# Patient Record
Sex: Male | Born: 1955 | Race: White | Hispanic: No | Marital: Married | Smoking: Former smoker
Health system: Southern US, Community
[De-identification: ages and names within clinical notes are randomized; demographics above are authoritative.]

## PROBLEM LIST (undated history)

## (undated) DIAGNOSIS — R972 Elevated prostate specific antigen [PSA]: Secondary | ICD-10-CM

## (undated) DIAGNOSIS — H269 Unspecified cataract: Secondary | ICD-10-CM

## (undated) DIAGNOSIS — N189 Chronic kidney disease, unspecified: Secondary | ICD-10-CM

## (undated) DIAGNOSIS — H409 Unspecified glaucoma: Secondary | ICD-10-CM

## (undated) DIAGNOSIS — T7840XA Allergy, unspecified, initial encounter: Secondary | ICD-10-CM

## (undated) HISTORY — PX: COLONOSCOPY: SHX174

## (undated) HISTORY — PX: OTHER SURGICAL HISTORY: SHX169

## (undated) HISTORY — PX: VASECTOMY: SHX75

## (undated) HISTORY — PX: PROSTATE BIOPSY: SHX241

## (undated) HISTORY — DX: Allergy, unspecified, initial encounter: T78.40XA

## (undated) HISTORY — DX: Unspecified cataract: H26.9

## (undated) HISTORY — DX: Elevated prostate specific antigen (PSA): R97.20

## (undated) HISTORY — DX: Chronic kidney disease, unspecified: N18.9

## (undated) HISTORY — PX: LITHOTRIPSY: SUR834

## (undated) HISTORY — DX: Unspecified glaucoma: H40.9

---

## 2019-05-18 ENCOUNTER — Encounter: Payer: Self-pay | Admitting: Gastroenterology

## 2019-06-15 ENCOUNTER — Other Ambulatory Visit: Payer: Self-pay

## 2019-06-15 ENCOUNTER — Ambulatory Visit: Payer: BC Managed Care – PPO | Admitting: *Deleted

## 2019-06-15 VITALS — Temp 97.3°F | Ht 71.0 in | Wt 153.0 lb

## 2019-06-15 DIAGNOSIS — Z1211 Encounter for screening for malignant neoplasm of colon: Secondary | ICD-10-CM

## 2019-06-15 MED ORDER — SUTAB 1479-225-188 MG PO TABS: 24.0000 | ORAL_TABLET | ORAL | 0 refills | Status: DC

## 2019-06-15 NOTE — Progress Notes (Signed)
05-12-2019- comp covid vaccines  No egg or soy allergy known to patient  No issues with past sedation with any surgeries  or procedures, no intubation problems  No diet pills per patient No home 02 use per patient  No blood thinners per patient  Pt denies issues with constipation  No A fib or A flutter  EMMI video sent to pt's e mail   Due to the COVID-19 pandemic we are asking patients to follow these guidelines. Please only bring one care partner. Please be aware that your care partner may wait in the car in the parking lot or if they feel like they will be too hot to wait in the car, they may wait in the lobby on the 4th floor. All care partners are required to wear a mask the entire time (we do not have any that we can provide them), they need to practice social distancing, and we will do a Covid check for all patient's and care partners when you arrive. Also we will check their temperature and your temperature. If the care partner waits in their car they need to stay in the parking lot the entire time and we will call them on their cell phone when the patient is ready for discharge so they can bring the car to the front of the building. Also all patient's will need to wear a mask into building.

## 2019-06-19 ENCOUNTER — Encounter: Payer: Self-pay | Admitting: Gastroenterology

## 2019-06-29 ENCOUNTER — Other Ambulatory Visit: Payer: Self-pay

## 2019-06-29 ENCOUNTER — Ambulatory Visit (AMBULATORY_SURGERY_CENTER): Payer: BC Managed Care – PPO | Admitting: Gastroenterology

## 2019-06-29 ENCOUNTER — Encounter: Payer: Self-pay | Admitting: Gastroenterology

## 2019-06-29 VITALS — BP 92/66 | HR 74 | Temp 97.5°F | Resp 16 | Ht 71.0 in | Wt 153.0 lb

## 2019-06-29 DIAGNOSIS — Z1211 Encounter for screening for malignant neoplasm of colon: Secondary | ICD-10-CM | POA: Diagnosis present

## 2019-06-29 MED ORDER — SODIUM CHLORIDE 0.9 % IV SOLN
500.0000 mL | INTRAVENOUS | Status: DC
Start: 2019-06-29 — End: 2023-01-12

## 2019-06-29 NOTE — Progress Notes (Signed)
pt tolerated well. VSS. awake and to recovery. Report given to RN.  

## 2019-06-29 NOTE — Patient Instructions (Signed)
Handouts on high fiber diet, hemorrhoids, and diverticulosis given to you today   YOU HAD AN ENDOSCOPIC PROCEDURE TODAY AT THE [facility]:   Refer to the procedure report that was given to you for any specific questions about what was found during the examination.  If the procedure report does not answer your questions, please call your gastroenterologist to clarify.  If you requested that your care partner not be given the details of your procedure findings, then the procedure report has been included in a sealed envelope for you to review at your convenience later.  YOU SHOULD EXPECT: Some feelings of bloating in the abdomen. Passage of more gas than usual.  Walking can help get rid of the air that was put into your GI tract during the procedure and reduce the bloating. If you had a lower endoscopy (such as a colonoscopy or flexible sigmoidoscopy) you may notice spotting of blood in your stool or on the toilet paper. If you underwent a bowel prep for your procedure, you may not have a normal bowel movement for a few days.  Please Note:  You might notice some irritation and congestion in your nose or some drainage.  This is from the oxygen used during your procedure.  There is no need for concern and it should clear up in a day or so.  SYMPTOMS TO REPORT IMMEDIATELY:   Following lower endoscopy (colonoscopy or flexible sigmoidoscopy):  Excessive amounts of blood in the stool  Significant tenderness or worsening of abdominal pains  Swelling of the abdomen that is new, acute  Fever of 100F or higher  For urgent or emergent issues, a gastroenterologist can be reached at any hour by calling [phone]. Do not use MyChart messaging for urgent concerns.    DIET:  We do recommend a small meal at first, but then you may proceed to your regular diet.  Drink plenty of fluids but you should avoid alcoholic beverages for 24 hours.  ACTIVITY:  You should plan to take it easy for the  rest of today and you should NOT DRIVE or use heavy machinery until tomorrow (because of the sedation medicines used during the test).    FOLLOW UP: Our staff will call the number listed on your records 48-72 hours following your procedure to check on you and address any questions or concerns that you may have regarding the information given to you following your procedure. If we do not reach you, we will leave a message.  We will attempt to reach you two times.  During this call, we will ask if you have developed any symptoms of COVID 19. If you develop any symptoms (ie: fever, flu-like symptoms, shortness of breath, cough etc.) before then, please call [phone].  If you test positive for Covid 19 in the 2 weeks post procedure, please call and report this information to [place].    SIGNATURES/CONFIDENTIALITY: You and/or your care partner have signed paperwork which will be entered into your electronic medical record.  These signatures attest to the fact that that the information above on your After Visit Summary has been reviewed and is understood.  Full responsibility of the confidentiality of this discharge information lies with you and/or your care-partner.

## 2019-06-29 NOTE — Op Note (Signed)
[place] Patient Name: Ricardo Orr Procedure Date: 06/29/2019 11:30 AM MRN: [id number] Endoscopist: Ladene Artist , MD Age: 64 Referring MD:  Date of Birth: 23-May-1955 Gender: Male Account #: [id number] Procedure:                Colonoscopy Indications:              Screening for colorectal malignant neoplasm Medicines:                Monitored Anesthesia Care Procedure:                Pre-Anesthesia Assessment:                           - Prior to the procedure, a History and Physical                            was performed, and patient medications and                            allergies were reviewed. The patient's tolerance of                            previous anesthesia was also reviewed. The risks                            and benefits of the procedure and the sedation                            options and risks were discussed with the patient.                            All questions were answered, and informed consent                            was obtained. Prior Anticoagulants: The patient has                            taken no previous anticoagulant or antiplatelet                            agents. ASA Grade Assessment: I - A normal, healthy                            patient. After reviewing the risks and benefits,                            the patient was deemed in satisfactory condition to                            undergo the procedure.                           After obtaining informed consent, the colonoscope  was passed under direct vision. Throughout the                            procedure, the patient's blood pressure, pulse, and                            oxygen saturations were monitored continuously. The                            Colonoscope was introduced through the anus and                            advanced to the the cecum, identified by                            appendiceal orifice and ileocecal  valve. The                            ileocecal valve, appendiceal orifice, and rectum                            were photographed. The quality of the bowel                            preparation was good. The colonoscopy was performed                            without difficulty. The patient tolerated the                            procedure well. Scope In: 11:38:06 AM Scope Out: 11:50:30 AM Scope Withdrawal Time: 0 hours 10 minutes 13 seconds  Total Procedure Duration: 0 hours 12 minutes 24 seconds  Findings:                 The perianal and digital rectal examinations were                            normal.                           Scattered small-mouthed diverticula were found in                            the entire colon. There was no evidence of                            diverticular bleeding.                           Internal hemorrhoids were found during                            retroflexion. The hemorrhoids were medium-sized and  Grade I (internal hemorrhoids that do not prolapse).                           The exam was otherwise without abnormality on                            direct and retroflexion views. Complications:            No immediate complications. Estimated blood loss:                            None. Estimated Blood Loss:     Estimated blood loss: none. Impression:               - Mild diverticulosis in the entire examined colon.                           - Internal hemorrhoids.                           - The examination was otherwise normal on direct                            and retroflexion views.                           - No specimens collected. Recommendation:           - Repeat colonoscopy in 10 years for screening                            purposes.                           - Patient has a contact number available for                            emergencies. The signs and symptoms of potential                             delayed complications were discussed with the                            patient. Return to normal activities tomorrow.                            Written discharge instructions were provided to the                            patient.                           - High fiber diet.                           - Continue present medications. Meryl Dare, MD 06/29/2019 11:53:08 AM This report has been signed electronically.

## 2019-06-29 NOTE — Progress Notes (Signed)
Vs CW I have reviewed the patient's medical history in detail and updated the computerized patient record.   

## 2019-07-03 ENCOUNTER — Telehealth: Payer: Self-pay

## 2019-07-03 NOTE — Telephone Encounter (Signed)
  Follow up Call-  Call back number 06/29/2019  Post procedure Call Back phone  # [phone]  Permission to leave phone message Yes     Patient questions:  Do you have a fever, pain , or abdominal swelling? No. Pain Score  0 *  Have you tolerated food without any problems? Yes.    Have you been able to return to your normal activities? Yes.    Do you have any questions about your discharge instructions: Diet   No. Medications  No. Follow up visit  No.  Do you have questions or concerns about your Care? No.  Actions: * If pain score is 4 or above: No action needed, pain <4.  1. Have you developed a fever since your procedure? no  2.   Have you had an respiratory symptoms (SOB or cough) since your procedure? no  3.   Have you tested positive for COVID 19 since your procedure no  4.   Have you had any family members/close contacts diagnosed with the COVID 19 since your procedure?  no   If yes to any of these questions please route to Laverna Peace, RN and Charlett Lango, RN

## 2019-07-12 ENCOUNTER — Other Ambulatory Visit: Payer: Self-pay

## 2019-07-13 ENCOUNTER — Ambulatory Visit (INDEPENDENT_AMBULATORY_CARE_PROVIDER_SITE_OTHER): Payer: BC Managed Care – PPO | Admitting: Family Medicine

## 2019-07-13 ENCOUNTER — Encounter: Payer: Self-pay | Admitting: Family Medicine

## 2019-07-13 VITALS — BP 116/64 | HR 75 | Temp 97.9°F | Wt 152.0 lb

## 2019-07-13 DIAGNOSIS — N200 Calculus of kidney: Secondary | ICD-10-CM

## 2019-07-13 DIAGNOSIS — M545 Low back pain: Secondary | ICD-10-CM

## 2019-07-13 DIAGNOSIS — Z0000 Encounter for general adult medical examination without abnormal findings: Secondary | ICD-10-CM | POA: Diagnosis not present

## 2019-07-13 DIAGNOSIS — H409 Unspecified glaucoma: Secondary | ICD-10-CM | POA: Diagnosis not present

## 2019-07-13 DIAGNOSIS — G8929 Other chronic pain: Secondary | ICD-10-CM

## 2019-07-13 DIAGNOSIS — N400 Enlarged prostate without lower urinary tract symptoms: Secondary | ICD-10-CM

## 2019-07-13 DIAGNOSIS — R972 Elevated prostate specific antigen [PSA]: Secondary | ICD-10-CM

## 2019-07-13 NOTE — Patient Instructions (Signed)
Preventive Care 41-62 Years Old, Male Preventive care refers to lifestyle choices and visits with your health care provider that can promote health and wellness. This includes:  A yearly physical exam. This is also called an annual well check.  Regular dental and eye exams.  Immunizations.  Screening for certain conditions.  Healthy lifestyle choices, such as eating a healthy diet, getting regular exercise, not using drugs or products that contain nicotine and tobacco, and limiting alcohol use. What can I expect for my preventive care visit? Physical exam Your health care provider will check:  Height and weight. These may be used to calculate body mass index (BMI), which is a measurement that tells if you are at a healthy weight.  Heart rate and blood pressure.  Your skin for abnormal spots. Counseling Your health care provider may ask you questions about:  Alcohol, tobacco, and drug use.  Emotional well-being.  Home and relationship well-being.  Sexual activity.  Eating habits.  Work and work Statistician. What immunizations do I need?  Influenza (flu) vaccine  This is recommended every year. Tetanus, diphtheria, and pertussis (Tdap) vaccine  You may need a Td booster every 10 years. Varicella (chickenpox) vaccine  You may need this vaccine if you have not already been vaccinated. Zoster (shingles) vaccine  You may need this after age 64. Measles, mumps, and rubella (MMR) vaccine  You may need at least one dose of MMR if you were born in 1957 or later. You may also need a second dose. Pneumococcal conjugate (PCV13) vaccine  You may need this if you have certain conditions and were not previously vaccinated. Pneumococcal polysaccharide (PPSV23) vaccine  You may need one or two doses if you smoke cigarettes or if you have certain conditions. Meningococcal conjugate (MenACWY) vaccine  You may need this if you have certain conditions. Hepatitis A  vaccine  You may need this if you have certain conditions or if you travel or work in places where you may be exposed to hepatitis A. Hepatitis B vaccine  You may need this if you have certain conditions or if you travel or work in places where you may be exposed to hepatitis B. Haemophilus influenzae type b (Hib) vaccine  You may need this if you have certain risk factors. Human papillomavirus (HPV) vaccine  If recommended by your health care provider, you may need three doses over 6 months. You may receive vaccines as individual doses or as more than one vaccine together in one shot (combination vaccines). Talk with your health care provider about the risks and benefits of combination vaccines. What tests do I need? Blood tests  Lipid and cholesterol levels. These may be checked every 5 years, or more frequently if you are over 60 years old.  Hepatitis C test.  Hepatitis B test. Screening  Lung cancer screening. You may have this screening every year starting at age 43 if you have a 30-pack-year history of smoking and currently smoke or have quit within the past 15 years.  Prostate cancer screening. Recommendations will vary depending on your family history and other risks.  Colorectal cancer screening. All adults should have this screening starting at age 72 and continuing until age 2. Your health care provider may recommend screening at age 14 if you are at increased risk. You will have tests every 1-10 years, depending on your results and the type of screening test.  Diabetes screening. This is done by checking your blood sugar (glucose) after you have not eaten  for a while (fasting). You may have this done every 1-3 years.  Sexually transmitted disease (STD) testing. Follow these instructions at home: Eating and drinking  Eat a diet that includes fresh fruits and vegetables, whole grains, lean protein, and low-fat dairy products.  Take vitamin and mineral supplements as  recommended by your health care provider.  Do not drink alcohol if your health care provider tells you not to drink.  If you drink alcohol: ? Limit how much you have to 0-2 drinks a day. ? Be aware of how much alcohol is in your drink. In the [place] one drink equals one 12 oz bottle of beer (355 mL), one 5 oz glass of wine (148 mL), or one 1 oz glass of hard liquor (44 mL). Lifestyle  Take daily care of your teeth and gums.  Stay active. Exercise for at least 30 minutes on 5 or more days each week.  Do not use any products that contain nicotine or tobacco, such as cigarettes, e-cigarettes, and chewing tobacco. If you need help quitting, ask your health care provider.  If you are sexually active, practice safe sex. Use a condom or other form of protection to prevent STIs (sexually transmitted infections).  Talk with your health care provider about taking a low-dose aspirin every day starting at age 53. What's next?  Go to your health care provider once a year for a well check visit.  Ask your health care provider how often you should have your eyes and teeth checked.  Stay up to date on all vaccines. This information is not intended to replace advice given to you by your health care provider. Make sure you discuss any questions you have with your health care provider. Document Revised: 01/12/2018 Document Reviewed: 01/12/2018 Elsevier Patient Education  2020 [employer].

## 2019-07-13 NOTE — Progress Notes (Signed)
New Patient Office Visit  Subjective:  Patient ID: Ricardo Orr, male    DOB: 1955-03-01  Age: 64 y.o. MRN: [id number]  CC:  Chief Complaint  Patient presents with   New Patient (Initial Visit)    pt is here to establish care with no other concerns     HPI Ricardo Orr presents for establishing care and for well visit.  He and his wife moved here last October from [place].  Past medical history reviewed.  He has glaucoma and is on drops for that.  He has had history of recurrent calcium kidney stones and has had a couple surgeries previously for that.  Has had prior vasectomy.  He reports history of elevated PSA.  He has some BPH and is on Avodart for that.  He states he has had 3 prostate biopsies previously all negative for cancer.  He also recalls having MRI of the prostate previously.  He is requesting referral to establish with a local urologist.  He also relates some intermittent chronic right low back pain for the past 2 years.  Usually worse after activities.  Currently very sedentary.  Prior to moving here he was fairly active with running several days per week and some cycling.  He has been working from home remotely 6 AM to sometimes as late as 10 PM.  Getting very little exercise.  Sitting most of the day  Denies any radiculitis symptoms.  No weakness or numbness.  No appetite or weight changes.  Social history-married.  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  He smoked briefly but quit 30 years ago.  Approximately 10-year history.  Only occasional alcohol use.  He has 3 children a daughter and 2 sons.  They are all grown.  Family history-both parents died the past year both at age 67.  His mother had some dementia late in life.  He has a sister and a brother who are alive and well.  Children have no medical problems.  Past Medical History:  Diagnosis Date   Allergy    Cataract    forming    Chronic kidney disease    chronic  kidney stones-    Elevated PSA    Glaucoma     Past Surgical History:  Procedure Laterality Date   COLONOSCOPY     11 yrs ago- normal per pt    kidney stone extraction     x2   LITHOTRIPSY     PROSTATE BIOPSY     x3   VASECTOMY      Family History  Problem Relation Age of Onset   Hearing loss Father    Colon cancer Neg Hx    Colon polyps Neg Hx    Esophageal cancer Neg Hx    Rectal cancer Neg Hx    Stomach cancer Neg Hx     Social History   Socioeconomic History   Marital status: Married    Spouse name: Not on file   Number of children: Not on file   Years of education: Not on file   Highest education level: Not on file  Occupational History   Not on file  Tobacco Use   Smoking status: Former Smoker   Smokeless tobacco: Never Used  Substance and Sexual Activity   Alcohol use: Yes    Comment: occ    Drug use: Never   Sexual activity: Yes    Partners: Female  Other Topics Concern   Not  on file  Social History Narrative   Not on file   Social Determinants of Health   Financial Resource Strain:    Difficulty of Paying Living Expenses:   Food Insecurity:    Worried About Charity fundraiser in the Last Year:    Arboriculturist in the Last Year:   Transportation Needs:    Film/video editor (Medical):    Lack of Transportation (Non-Medical):   Physical Activity:    Days of Exercise per Week:    Minutes of Exercise per Session:   Stress:    Feeling of Stress :   Social Connections:    Frequency of Communication with Friends and Family:    Frequency of Social Gatherings with Friends and Family:    Attends Religious Services:    Active Member of Clubs or Organizations:    Attends Music therapist:    Marital Status:   Intimate Partner Violence:    Fear of Current or Ex-Partner:    Emotionally Abused:    Physically Abused:    Sexually Abused:     ROS Review of Systems  Constitutional:  Negative for activity change, appetite change, fatigue, fever and unexpected weight change.  HENT: Negative for congestion, ear pain and trouble swallowing.   Eyes: Negative for pain and visual disturbance.  Respiratory: Negative for cough, shortness of breath and wheezing.   Cardiovascular: Negative for chest pain and palpitations.  Gastrointestinal: Negative for abdominal distention, abdominal pain, blood in stool, constipation, diarrhea, nausea, rectal pain and vomiting.  Genitourinary: Negative for dysuria, hematuria and testicular pain.  Musculoskeletal: Positive for back pain. Negative for arthralgias and joint swelling.  Skin: Negative for rash.  Neurological: Negative for dizziness, syncope and headaches.  Hematological: Negative for adenopathy.  Psychiatric/Behavioral: Negative for confusion and dysphoric mood.    Objective:   Today's Vitals: BP 116/64 (BP Location: Left Arm, Patient Position: Sitting, Cuff Size: Normal)    Pulse 75    Temp 97.9 F (36.6 C) (Temporal)    Wt 152 lb (68.9 kg)    SpO2 94%    BMI 21.20 kg/m   Physical Exam Vitals reviewed.  Constitutional:      Appearance: Normal appearance.  HENT:     Right Ear: Tympanic membrane normal.     Left Ear: Tympanic membrane normal.  Cardiovascular:     Rate and Rhythm: Normal rate and regular rhythm.     Heart sounds: No murmur heard.   Pulmonary:     Effort: Pulmonary effort is normal.     Breath sounds: Normal breath sounds.  Abdominal:     Palpations: Abdomen is soft. There is no mass.     Tenderness: There is no abdominal tenderness.  Musculoskeletal:     Cervical back: Neck supple.     Right lower leg: No edema.     Left lower leg: No edema.  Lymphadenopathy:     Cervical: No cervical adenopathy.  Skin:    Findings: No rash.  Neurological:     General: No focal deficit present.     Mental Status: He is alert.     Cranial Nerves: No cranial nerve deficit.  Psychiatric:        Mood and Affect:  Mood normal.        Thought Content: Thought content normal.     Assessment & Plan:   #1 physical exam.  The following issues were addressed from health maintenance standpoint -Covid vaccines already given -Colonoscopy  was just completed -No records of known hepatitis C screening and this will be done -Confirm date of last tetanus but he thinks this was in the past couple years -Has already received previous shingles vaccine  #2 history of elevated PSA with negative previous prostate biopsies.  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  This sounds more musculoskeletal from history.  Outpatient Encounter Medications as of 07/13/2019  Medication Sig   cetirizine (ZYRTEC) 10 MG tablet Take 10 mg by mouth daily.   dutasteride (AVODART) 0.5 MG capsule    fluticasone (FLONASE) 50 MCG/ACT nasal spray Place into both nostrils daily as needed for allergies or rhinitis.   latanoprost (XALATAN) 0.005 % ophthalmic solution    sildenafil (REVATIO) 20 MG tablet TAKE 1 TO 5 TABLETS BY MOUTH 30 MINUTES PRIOR TO ACTIVITY   Sodium Sulfate-Mag Sulfate-KCl (SUTAB) [phone] MG TABS Take 24 tablets by mouth as directed.   Facility-Administered Encounter Medications as of 07/13/2019  Medication   0.9 %  sodium chloride infusion    Follow-up: No follow-ups on file.   Evelena Peat, MD

## 2019-07-14 DIAGNOSIS — R972 Elevated prostate specific antigen [PSA]: Secondary | ICD-10-CM | POA: Insufficient documentation

## 2019-07-14 DIAGNOSIS — N400 Enlarged prostate without lower urinary tract symptoms: Secondary | ICD-10-CM | POA: Insufficient documentation

## 2019-07-16 LAB — HEPATITIS C ANTIBODY
Hepatitis C Ab: NONREACTIVE
SIGNAL TO CUT-OFF: 0.01 (ref <1.00)

## 2019-07-16 LAB — HEPATIC FUNCTION PANEL
AG Ratio: 2.4 (calc) (ref 1.0–2.5)
ALT: 11 U/L (ref 9–46)
AST: 15 U/L (ref 10–35)
Albumin: 4.7 g/dL (ref 3.6–5.1)
Alkaline phosphatase (APISO): 63 U/L (ref 35–144)
Bilirubin, Direct: 0.1 mg/dL (ref 0.0–0.2)
Globulin: 2 g/dL (calc) (ref 1.9–3.7)
Indirect Bilirubin: 0.7 mg/dL (calc) (ref 0.2–1.2)
Total Bilirubin: 0.8 mg/dL (ref 0.2–1.2)
Total Protein: 6.7 g/dL (ref 6.1–8.1)

## 2019-07-16 LAB — LIPID PANEL
Cholesterol: 215 mg/dL — ABNORMAL HIGH (ref <200)
HDL: 63 mg/dL (ref >=40)
LDL Cholesterol (Calc): 135 mg/dL (calc) — ABNORMAL HIGH
Non-HDL Cholesterol (Calc): 152 mg/dL (calc) — ABNORMAL HIGH (ref <130)
Total CHOL/HDL Ratio: 3.4 (calc) (ref <5.0)
Triglycerides: 80 mg/dL (ref <150)

## 2019-07-16 LAB — CBC WITH DIFFERENTIAL/PLATELET
Absolute Monocytes: 462 cells/uL (ref 200–950)
Basophils Absolute: 78 cells/uL (ref 0–200)
Basophils Relative: 1.3 %
Eosinophils Absolute: 48 cells/uL (ref 15–500)
Eosinophils Relative: 0.8 %
HCT: 44.2 % (ref 38.5–50.0)
Hemoglobin: 15.1 g/dL (ref 13.2–17.1)
Lymphs Abs: 1200 cells/uL (ref 850–3900)
MCH: 30.8 pg (ref 27.0–33.0)
MCHC: 34.2 g/dL (ref 32.0–36.0)
MCV: 90 fL (ref 80.0–100.0)
MPV: 10.8 fL (ref 7.5–12.5)
Monocytes Relative: 7.7 %
Neutro Abs: 4212 cells/uL (ref 1500–7800)
Neutrophils Relative %: 70.2 %
Platelets: 219 10*3/uL (ref 140–400)
RBC: 4.91 10*6/uL (ref 4.20–5.80)
RDW: 13 % (ref 11.0–15.0)
Total Lymphocyte: 20 %
WBC: 6 10*3/uL (ref 3.8–10.8)

## 2019-07-16 LAB — SEDIMENTATION RATE: Sed Rate: 6 mm/h (ref 0–20)

## 2019-07-16 LAB — TSH: TSH: 1.59 mIU/L (ref 0.40–4.50)

## 2019-07-16 LAB — BASIC METABOLIC PANEL
BUN: 22 mg/dL (ref 7–25)
CO2: 27 mmol/L (ref 20–32)
Calcium: 9.8 mg/dL (ref 8.6–10.3)
Chloride: 102 mmol/L (ref 98–110)
Creat: 0.99 mg/dL (ref 0.70–1.25)
Glucose, Bld: 75 mg/dL (ref 65–99)
Potassium: 4.3 mmol/L (ref 3.5–5.3)
Sodium: 141 mmol/L (ref 135–146)

## 2019-07-23 ENCOUNTER — Encounter: Payer: Self-pay | Admitting: Family Medicine
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
## 2020-11-07 ENCOUNTER — Ambulatory Visit: Payer: Self-pay

## 2020-11-07 ENCOUNTER — Ambulatory Visit (INDEPENDENT_AMBULATORY_CARE_PROVIDER_SITE_OTHER): Payer: BC Managed Care – PPO | Admitting: Family Medicine

## 2020-11-07 ENCOUNTER — Other Ambulatory Visit: Payer: Self-pay

## 2020-11-07 VITALS — BP 122/84 | HR 72 | Ht 71.0 in | Wt 152.0 lb

## 2020-11-07 DIAGNOSIS — M19049 Primary osteoarthritis, unspecified hand: Secondary | ICD-10-CM | POA: Diagnosis not present

## 2020-11-07 DIAGNOSIS — M25539 Pain in unspecified wrist: Secondary | ICD-10-CM | POA: Diagnosis not present

## 2020-11-07 DIAGNOSIS — M25521 Pain in right elbow: Secondary | ICD-10-CM | POA: Diagnosis not present

## 2020-11-07 MED ORDER — DOXYCYCLINE HYCLATE 100 MG PO TABS
100.0000 mg | ORAL_TABLET | Freq: Two times a day (BID) | ORAL | 0 refills | Status: DC
Start: 2020-11-07 — End: 2020-12-30

## 2020-11-07 NOTE — Assessment & Plan Note (Signed)
Patient had a fall many months ago.  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  Patient is tender in this area.  We will put patient on doxycycline as needed discussed that there should be absorbed over the course of time.  Discussed which activities to do and which ones to avoid.  Increase activity slowly.  Follow-up with me again in 6 to 8 weeks.

## 2020-11-07 NOTE — Patient Instructions (Addendum)
Thumb spica wear at night Voltaren gel and heat for finger See you again in 4-6 weeks

## 2020-11-07 NOTE — Assessment & Plan Note (Signed)
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  We discussed potential x-rays which patient declined.  Patient does have some hypoechoic changes noted discussed which activities to do which wants to avoid.  Patient decreased any type of repetitive motion.  Discussed home exercises.  Discussed placing overnight given a thumb spica splint.  Patient will increase activity slowly.  Follow-up again in 6 to 8 weeks.

## 2020-12-02 DIAGNOSIS — H401131 Primary open-angle glaucoma, bilateral, mild stage: Secondary | ICD-10-CM | POA: Diagnosis not present

## 2020-12-02 DIAGNOSIS — H25812 Combined forms of age-related cataract, left eye: Secondary | ICD-10-CM | POA: Diagnosis not present

## 2020-12-02 DIAGNOSIS — H43811 Vitreous degeneration, right eye: Secondary | ICD-10-CM | POA: Diagnosis not present

## 2020-12-02 DIAGNOSIS — H2511 Age-related nuclear cataract, right eye: Secondary | ICD-10-CM | POA: Diagnosis not present

## 2020-12-22 DIAGNOSIS — H401131 Primary open-angle glaucoma, bilateral, mild stage: Secondary | ICD-10-CM | POA: Diagnosis not present

## 2020-12-22 NOTE — Progress Notes (Signed)
[facility] [address] [place] [address] Phone: [phone] Subjective:   Ricardo Orr, am serving as a scribe for Dr. Antoine Orr. This visit occurred during the SARS-CoV-2 public health emergency.  Safety protocols were in place, including screening questions prior to the visit, additional usage of staff PPE, and extensive cleaning of exam room while observing appropriate contact time as indicated for disinfecting solutions.   I'm seeing this patient by the request  of:  Ricardo Covey, MD  CC: Hand and elbow pain  [id number]  11/07/2020 Patient had a fall many months ago.  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  Patient is tender in this area.  We will put patient on doxycycline as needed discussed that there should be absorbed over the course of time.  Discussed which activities to do and which ones to avoid.  Increase activity slowly.  Follow-up with me again in 6 to 8 weeks. 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  We discussed potential x-rays which patient declined.  Patient does have some hypoechoic changes noted discussed which activities to do which wants to avoid.  Patient decreased any type of repetitive motion.  Discussed home exercises.  Discussed placing overnight given a thumb spica splint.  Patient will increase activity slowly.  Follow-up again in 6 to 8 weeks.  Updated 12/23/2020 Ricardo Orr is a 65 y.o. male coming in with complaint of wrist pain and seem to be more of the Ricardo Orr arthritis.  And patient did have what appeared to be calcific changes of the elbow with potential loose bodies.  Due to the potential for infection secondary to the changes or the calcium oxalate was given doxycycline and discussed different dietary issues.  Patient given a brace for Continue topical  anti-inflammatories.  Patient states wrist doing well. Voltaren works well on it. No new complaints. Check infection in elbow..  Patient states that he would feel 100% better at this time.       Past Medical History:  Diagnosis Date   Allergy    Cataract    forming    Chronic kidney disease    chronic kidney stones-    Elevated PSA    Glaucoma    Past Surgical History:  Procedure Laterality Date   COLONOSCOPY     11 yrs ago- normal per pt    kidney stone extraction     x2   LITHOTRIPSY     PROSTATE BIOPSY     x3   VASECTOMY     Social History   Socioeconomic History   Marital status: Married    Spouse name: Not on file   Number of children: Not on file   Years of education: Not on file   Highest education level: Not on file  Occupational History   Not on file  Tobacco Use   Smoking status: Former   Smokeless tobacco: Never  Substance and Sexual Activity   Alcohol use: Yes    Comment: occ    Drug use: Never   Sexual activity: Yes    Partners: Female  Other Topics Concern   Not on file  Social History Narrative   Not on file   Social Determinants of Health   Financial Resource Strain: Not on file  Food Insecurity: Not on file  Transportation Needs: Not on file  Physical Activity: Not on file  Stress: Not on file  Social Connections: Not on file   No Known Allergies Family History  Problem Relation Age of Onset   Dementia Mother    Hearing loss Father    Colon cancer Neg Hx    Colon polyps Neg Hx    Esophageal cancer Neg Hx    Rectal cancer Neg Hx    Stomach cancer Neg Hx       Current Outpatient Medications (Cardiovascular):    sildenafil (REVATIO) 20 MG tablet, TAKE 1 TO 5 TABLETS BY MOUTH 30 MINUTES PRIOR TO ACTIVITY   Current Outpatient Medications (Respiratory):    cetirizine (ZYRTEC) 10 MG tablet, Take 10 mg by mouth daily.   fluticasone (FLONASE) 50 MCG/ACT nasal spray, Place into both nostrils daily as needed for allergies or  rhinitis.       Current Outpatient Medications (Other):    doxycycline (VIBRA-TABS) 100 MG tablet, Take 1 tablet (100 mg total) by mouth 2 (two) times daily.   dutasteride (AVODART) 0.5 MG capsule,    latanoprost (XALATAN) 0.005 % ophthalmic solution,    Sodium Sulfate-Mag Sulfate-KCl (SUTAB) [phone] MG TABS, Take 24 tablets by mouth as directed.  Current Facility-Administered Medications (Other):    0.9 %  sodium chloride infusion    Objective  Blood pressure 122/82, pulse 84, height 5\' 11"  (1.803 m), weight 155 lb (70.3 kg), SpO2 98 %.   General: No apparent distress alert and oriented x3 mood and affect normal, dressed appropriately.  HEENT: Pupils equal, extraocular movements intact  Respiratory: Patient's speak in full sentences and does not appear short of breath  Cardiovascular: No lower extremity edema, non tender, no erythema  Gait normal with good balance and coordination.  MSK: Hand exam shows no significant difficulty.  Negative Tinel's Right elbow exam does have good range of motion.  Nontender on exam today.  No significant swelling.   Impression and Recommendations:     The above documentation has been reviewed and is accurate and complete , DO

## 2020-12-23 ENCOUNTER — Ambulatory Visit (INDEPENDENT_AMBULATORY_CARE_PROVIDER_SITE_OTHER): Payer: BC Managed Care – PPO | Admitting: Family Medicine

## 2020-12-23 ENCOUNTER — Other Ambulatory Visit: Payer: Self-pay

## 2020-12-23 ENCOUNTER — Ambulatory Visit: Payer: Self-pay

## 2020-12-23 VITALS — BP 122/82 | HR 84 | Ht 71.0 in | Wt 155.0 lb

## 2020-12-23 DIAGNOSIS — M25539 Pain in unspecified wrist: Secondary | ICD-10-CM | POA: Diagnosis not present

## 2020-12-23 DIAGNOSIS — M19049 Primary osteoarthritis, unspecified hand: Secondary | ICD-10-CM | POA: Diagnosis not present

## 2020-12-23 DIAGNOSIS — M25521 Pain in right elbow: Secondary | ICD-10-CM

## 2020-12-23 NOTE — Patient Instructions (Signed)
See me when you need me 

## 2020-12-23 NOTE — Assessment & Plan Note (Signed)
Improvement noted at this time.  Discussed worsening pain can consider the possibility of other injections.  Discussed with patient about potential bracing at night still.  Follow-up with me as needed

## 2020-12-23 NOTE — Assessment & Plan Note (Signed)
Has some loose bodies noted.  Patient knows not having any difficulty.  No signs of any inflammation at the moment either.  Full range of motion.  Patient can follow-up as needed.

## 2020-12-30 ENCOUNTER — Ambulatory Visit (INDEPENDENT_AMBULATORY_CARE_PROVIDER_SITE_OTHER): Payer: BC Managed Care – PPO | Admitting: Family Medicine

## 2020-12-30 VITALS — BP 120/80 | HR 75 | Temp 98.1°F | Ht 71.0 in | Wt 155.0 lb

## 2020-12-30 DIAGNOSIS — Z0000 Encounter for general adult medical examination without abnormal findings: Secondary | ICD-10-CM

## 2020-12-30 LAB — CBC WITH DIFFERENTIAL/PLATELET
Basophils Absolute: 0.1 10*3/uL (ref 0.0–0.1)
Basophils Relative: 1.1 % (ref 0.0–3.0)
Eosinophils Absolute: 0.1 10*3/uL (ref 0.0–0.7)
Eosinophils Relative: 2 % (ref 0.0–5.0)
HCT: 43.6 % (ref 39.0–52.0)
Hemoglobin: 14.9 g/dL (ref 13.0–17.0)
Lymphocytes Relative: 24.5 % (ref 12.0–46.0)
Lymphs Abs: 1.3 10*3/uL (ref 0.7–4.0)
MCHC: 34.3 g/dL (ref 30.0–36.0)
MCV: 91 fl (ref 78.0–100.0)
Monocytes Absolute: 0.6 10*3/uL (ref 0.1–1.0)
Monocytes Relative: 11.5 % (ref 3.0–12.0)
Neutro Abs: 3.1 10*3/uL (ref 1.4–7.7)
Neutrophils Relative %: 60.9 % (ref 43.0–77.0)
Platelets: 197 10*3/uL (ref 150.0–400.0)
RBC: 4.79 Mil/uL (ref 4.22–5.81)
RDW: 13 % (ref 11.5–15.5)
WBC: 5.2 10*3/uL (ref 4.0–10.5)

## 2020-12-30 LAB — HEPATIC FUNCTION PANEL
ALT: 13 U/L (ref 0–53)
AST: 17 U/L (ref 0–37)
Albumin: 4.5 g/dL (ref 3.5–5.2)
Alkaline Phosphatase: 65 U/L (ref 39–117)
Bilirubin, Direct: 0.1 mg/dL (ref 0.0–0.3)
Total Bilirubin: 0.8 mg/dL (ref 0.2–1.2)
Total Protein: 7.2 g/dL (ref 6.0–8.3)

## 2020-12-30 LAB — TSH: TSH: 1.72 u[IU]/mL (ref 0.35–5.50)

## 2020-12-30 LAB — BASIC METABOLIC PANEL
BUN: 22 mg/dL (ref 6–23)
CO2: 30 mEq/L (ref 19–32)
Calcium: 9.9 mg/dL (ref 8.4–10.5)
Chloride: 102 mEq/L (ref 96–112)
Creatinine, Ser: 1.17 mg/dL (ref 0.40–1.50)
GFR: 66.16 mL/min (ref >60.00)
Glucose, Bld: 95 mg/dL (ref 70–99)
Potassium: 4.2 mEq/L (ref 3.5–5.1)
Sodium: 138 mEq/L (ref 135–145)

## 2020-12-30 LAB — LIPID PANEL
Cholesterol: 205 mg/dL — ABNORMAL HIGH (ref 0–200)
HDL: 64.8 mg/dL (ref >39.00)
LDL Cholesterol: 125 mg/dL — ABNORMAL HIGH (ref 0–99)
NonHDL: 140.59
Total CHOL/HDL Ratio: 3
Triglycerides: 77 mg/dL (ref 0.0–149.0)
VLDL: 15.4 mg/dL (ref 0.0–40.0)

## 2020-12-30 NOTE — Progress Notes (Signed)
Established Patient Office Visit  Subjective:  Patient ID: Ricardo Orr, male    DOB: 05/04/1955  Age: 65 y.o. MRN: [id number]  CC:  Chief Complaint  Patient presents with   Annual Exam    HPI Ricardo Orr presents for physical exam.   He just established care here a year ago.  Family moved here from [place].  He continues to work with [employer].  Has history of kidney stones and history of BPH with elevated PSAs.  Has had multiple previous biopsies negative for cancer.  He has established with local urologist.  Generally doing well.  Flu vaccine already given.  COVID vaccines up-to-date.  Tetanus up-to-date.  He thinks he had tetanus booster with Tdap 2020.  Shingles vaccine already given.  Colonoscopy up-to-date.  He is getting PSA screening through urology  Social history-married and has 3 children including 1 daughter and 2 sons.  His daughter lives here and she has 2 children ages 23 and 83.  His sons live in [place] and [place].  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  Smoked only briefly and quit over 30 years ago.  Only occasional alcohol use.  Family history-reviewed.  Parents both had dementia mid 90s.  He has a brother who has history of what sounds like cervical spondylosis with cord compression.  Sister alive and well.  No family history of premature CAD or diabetes.  Past Medical History:  Diagnosis Date   Allergy    Cataract    forming    Chronic kidney disease    chronic kidney stones-    Elevated PSA    Glaucoma     Past Surgical History:  Procedure Laterality Date   COLONOSCOPY     11 yrs ago- normal per pt    kidney stone extraction     x2   LITHOTRIPSY     PROSTATE BIOPSY     x3   VASECTOMY      Family History  Problem Relation Age of Onset   Dementia Mother    Hearing loss Father    Colon cancer Neg Hx    Colon polyps Neg Hx    Esophageal cancer Neg Hx    Rectal cancer Neg Hx    Stomach cancer Neg Hx     Social  History   Socioeconomic History   Marital status: Married    Spouse name: Not on file   Number of children: Not on file   Years of education: Not on file   Highest education level: Not on file  Occupational History   Not on file  Tobacco Use   Smoking status: Former   Smokeless tobacco: Never  Substance and Sexual Activity   Alcohol use: Yes    Comment: occ    Drug use: Never   Sexual activity: Yes    Partners: Female  Other Topics Concern   Not on file  Social History Narrative   Not on file   Social Determinants of Health   Financial Resource Strain: Not on file  Food Insecurity: Not on file  Transportation Needs: Not on file  Physical Activity: Not on file  Stress: Not on file  Social Connections: Not on file  Intimate Partner Violence: Not on file    Outpatient Medications Prior to Visit  Medication Sig Dispense Refill   dutasteride (AVODART) 0.5 MG capsule      latanoprost (XALATAN) 0.005 % ophthalmic solution      sildenafil (REVATIO) 20 MG tablet TAKE  1 TO 5 TABLETS BY MOUTH 30 MINUTES PRIOR TO ACTIVITY     cetirizine (ZYRTEC) 10 MG tablet Take 10 mg by mouth daily.     doxycycline (VIBRA-TABS) 100 MG tablet Take 1 tablet (100 mg total) by mouth 2 (two) times daily. 14 tablet 0   fluticasone (FLONASE) 50 MCG/ACT nasal spray Place into both nostrils daily as needed for allergies or rhinitis.     Sodium Sulfate-Mag Sulfate-KCl (SUTAB) [phone] MG TABS Take 24 tablets by mouth as directed. 24 tablet 0   Facility-Administered Medications Prior to Visit  Medication Dose Route Frequency Provider Last Rate Last Admin   0.9 %  sodium chloride infusion  500 mL Intravenous Continuous Ladene Artist, MD        No Known Allergies  ROS Review of Systems  Constitutional:  Negative for activity change, appetite change, fatigue and fever.  HENT:  Negative for congestion, ear pain and trouble swallowing.   Eyes:  Negative for pain and visual disturbance.   Respiratory:  Negative for cough, shortness of breath and wheezing.   Cardiovascular:  Negative for chest pain and palpitations.  Gastrointestinal:  Negative for abdominal distention, abdominal pain, blood in stool, constipation, diarrhea, nausea, rectal pain and vomiting.  Genitourinary:  Negative for dysuria, hematuria and testicular pain.  Musculoskeletal:  Negative for arthralgias and joint swelling.  Skin:  Negative for rash.  Neurological:  Negative for dizziness, syncope and headaches.  Hematological:  Negative for adenopathy.  Psychiatric/Behavioral:  Negative for confusion and dysphoric mood.      Objective:    Physical Exam Constitutional:      General: He is not in acute distress.    Appearance: He is well-developed.  HENT:     Head: Normocephalic and atraumatic.     Right Ear: External ear normal.     Left Ear: External ear normal.  Eyes:     Conjunctiva/sclera: Conjunctivae normal.     Pupils: Pupils are equal, round, and reactive to light.  Neck:     Thyroid: No thyromegaly.  Cardiovascular:     Rate and Rhythm: Normal rate and regular rhythm.     Heart sounds: Normal heart sounds. No murmur heard. Pulmonary:     Effort: No respiratory distress.     Breath sounds: No wheezing or rales.  Abdominal:     General: Bowel sounds are normal. There is no distension.     Palpations: Abdomen is soft. There is no mass.     Tenderness: There is no abdominal tenderness. There is no guarding or rebound.  Musculoskeletal:     Cervical back: Normal range of motion and neck supple.     Right lower leg: No edema.     Left lower leg: No edema.  Lymphadenopathy:     Cervical: No cervical adenopathy.  Skin:    Findings: No rash.  Neurological:     Mental Status: He is alert and oriented to person, place, and time.     Cranial Nerves: No cranial nerve deficit.     Deep Tendon Reflexes: Reflexes normal.    BP 120/80 (BP Location: Left Arm, Patient Position: Sitting, Cuff  Size: Normal)   Pulse 75   Temp 98.1 F (36.7 C) (Oral)   Ht 5\' 11"  (1.803 m)   Wt 155 lb (70.3 kg)   SpO2 99%   BMI 21.62 kg/m  Wt Readings from Last 3 Encounters:  12/30/20 155 lb (70.3 kg)  12/23/20 155 lb (70.3 kg)  11/07/20  152 lb (68.9 kg)     Health Maintenance Due  Topic Date Due   HIV Screening  Never done   TETANUS/TDAP  Never done   COVID-19 Vaccine (3 - Booster for Pfizer series) 06/23/2019    There are no preventive care reminders to display for this patient.  Lab Results  Component Value Date   TSH 1.59 07/13/2019   Lab Results  Component Value Date   WBC 6.0 07/13/2019   HGB 15.1 07/13/2019   HCT 44.2 07/13/2019   MCV 90.0 07/13/2019   PLT 219 07/13/2019   Lab Results  Component Value Date   NA 141 07/13/2019   K 4.3 07/13/2019   CO2 27 07/13/2019   GLUCOSE 75 07/13/2019   BUN 22 07/13/2019   CREATININE 0.99 07/13/2019   BILITOT 0.8 07/13/2019   AST 15 07/13/2019   ALT 11 07/13/2019   PROT 6.7 07/13/2019   CALCIUM 9.8 07/13/2019   Lab Results  Component Value Date   CHOL 215 (H) 07/13/2019   Lab Results  Component Value Date   HDL 63 07/13/2019   Lab Results  Component Value Date   LDLCALC 135 (H) 07/13/2019   Lab Results  Component Value Date   TRIG 80 07/13/2019   Lab Results  Component Value Date   CHOLHDL 3.4 07/13/2019   No results found for: HGBA1C    Assessment & Plan:   Problem List Items Addressed This Visit   None Visit Diagnoses     Physical exam    -  Primary   Relevant Orders   Basic metabolic panel   Lipid panel   CBC with Differential/Platelet   TSH   Hepatic function panel     65 year old male with history of kidney stones and elevated PSA with negative prostate biopsies for cancer.  He also has glaucoma followed by ophthalmology.  We discussed the following health maintenance issues  -Immunizations up-to-date including flu vaccine. -Colonoscopy up-to-date -Obtain screening labs as above.  We  will leave off PSA as he gets this through urology and plans to see them in February -Try to establish more consistent exercise habits  No orders of the defined types were placed in this encounter.   Follow-up: No follow-ups on file.    Carolann Littler, MD

## 2021-01-09 DIAGNOSIS — H401121 Primary open-angle glaucoma, left eye, mild stage: Secondary | ICD-10-CM | POA: Diagnosis not present

## 2021-02-17 DIAGNOSIS — H43811 Vitreous degeneration, right eye: Secondary | ICD-10-CM | POA: Diagnosis not present

## 2021-02-17 DIAGNOSIS — H25812 Combined forms of age-related cataract, left eye: Secondary | ICD-10-CM | POA: Diagnosis not present

## 2021-02-17 DIAGNOSIS — H2511 Age-related nuclear cataract, right eye: Secondary | ICD-10-CM | POA: Diagnosis not present

## 2021-02-17 DIAGNOSIS — H401131 Primary open-angle glaucoma, bilateral, mild stage: Secondary | ICD-10-CM | POA: Diagnosis not present

## 2021-03-03 DIAGNOSIS — R972 Elevated prostate specific antigen [PSA]: Secondary | ICD-10-CM | POA: Diagnosis not present

## 2021-03-16 DIAGNOSIS — R311 Benign essential microscopic hematuria: Secondary | ICD-10-CM | POA: Diagnosis not present

## 2021-03-16 DIAGNOSIS — R972 Elevated prostate specific antigen [PSA]: Secondary | ICD-10-CM | POA: Diagnosis not present

## 2021-03-16 DIAGNOSIS — R3915 Urgency of urination: Secondary | ICD-10-CM | POA: Diagnosis not present

## 2021-03-16 DIAGNOSIS — Z87442 Personal history of urinary calculi: Secondary | ICD-10-CM | POA: Diagnosis not present

## 2021-05-14 ENCOUNTER — Other Ambulatory Visit (HOSPITAL_COMMUNITY): Payer: Self-pay | Admitting: Nurse Practitioner

## 2021-05-14 ENCOUNTER — Other Ambulatory Visit: Payer: Self-pay | Admitting: Nurse Practitioner

## 2021-05-14 DIAGNOSIS — N200 Calculus of kidney: Secondary | ICD-10-CM | POA: Diagnosis not present

## 2021-05-14 DIAGNOSIS — R310 Gross hematuria: Secondary | ICD-10-CM

## 2021-05-14 DIAGNOSIS — R8271 Bacteriuria: Secondary | ICD-10-CM | POA: Diagnosis not present

## 2021-05-21 ENCOUNTER — Ambulatory Visit (HOSPITAL_COMMUNITY): Payer: Self-pay

## 2021-05-21 ENCOUNTER — Other Ambulatory Visit: Payer: Self-pay | Admitting: Urology

## 2021-05-21 ENCOUNTER — Encounter (HOSPITAL_COMMUNITY): Payer: Self-pay

## 2021-05-21 ENCOUNTER — Ambulatory Visit
Admission: RE | Admit: 2021-05-21 | Discharge: 2021-05-21 | Disposition: A | Discharge status: Home / Self Care | Payer: BC Managed Care – PPO | Source: Ambulatory Visit | Attending: Urology | Admitting: Urology

## 2021-05-21 DIAGNOSIS — R310 Gross hematuria: Secondary | ICD-10-CM

## 2021-05-21 DIAGNOSIS — N200 Calculus of kidney: Secondary | ICD-10-CM

## 2021-05-21 DIAGNOSIS — N201 Calculus of ureter: Secondary | ICD-10-CM | POA: Diagnosis not present

## 2021-05-22 ENCOUNTER — Other Ambulatory Visit: Payer: Self-pay | Admitting: Urology

## 2021-05-22 DIAGNOSIS — N200 Calculus of kidney: Secondary | ICD-10-CM

## 2021-05-26 ENCOUNTER — Emergency Department (HOSPITAL_COMMUNITY): Payer: BC Managed Care – PPO

## 2021-05-26 ENCOUNTER — Emergency Department (HOSPITAL_COMMUNITY)
Admission: EM | Admit: 2021-05-26 | Discharge: 2021-05-27 | Disposition: A | Discharge status: Home / Self Care | Payer: BC Managed Care – PPO | Attending: Emergency Medicine | Admitting: Emergency Medicine

## 2021-05-26 ENCOUNTER — Encounter (HOSPITAL_COMMUNITY): Payer: Self-pay

## 2021-05-26 ENCOUNTER — Other Ambulatory Visit: Payer: Self-pay

## 2021-05-26 DIAGNOSIS — R000 Tachycardia, unspecified: Secondary | ICD-10-CM | POA: Insufficient documentation

## 2021-05-26 DIAGNOSIS — N200 Calculus of kidney: Secondary | ICD-10-CM | POA: Insufficient documentation

## 2021-05-26 DIAGNOSIS — R109 Unspecified abdominal pain: Secondary | ICD-10-CM | POA: Diagnosis not present

## 2021-05-26 DIAGNOSIS — N1330 Unspecified hydronephrosis: Secondary | ICD-10-CM | POA: Diagnosis not present

## 2021-05-26 LAB — BASIC METABOLIC PANEL
Anion gap: 15 (ref 5–15)
BUN: 22 mg/dL (ref 8–23)
CO2: 21 mmol/L — ABNORMAL LOW (ref 22–32)
Calcium: 9.6 mg/dL (ref 8.9–10.3)
Chloride: 103 mmol/L (ref 98–111)
Creatinine, Ser: 1.32 mg/dL — ABNORMAL HIGH (ref 0.61–1.24)
GFR, Estimated: >60 mL/min (ref >60)
Glucose, Bld: 132 mg/dL — ABNORMAL HIGH (ref 70–99)
Potassium: 3.9 mmol/L (ref 3.5–5.1)
Sodium: 139 mmol/L (ref 135–145)

## 2021-05-26 LAB — CBC
HCT: 42.3 % (ref 39.0–52.0)
Hemoglobin: 14.7 g/dL (ref 13.0–17.0)
MCH: 31.3 pg (ref 26.0–34.0)
MCHC: 34.8 g/dL (ref 30.0–36.0)
MCV: 90 fL (ref 80.0–100.0)
Platelets: 238 10*3/uL (ref 150–400)
RBC: 4.7 MIL/uL (ref 4.22–5.81)
RDW: 12.3 % (ref 11.5–15.5)
WBC: 14.4 10*3/uL — ABNORMAL HIGH (ref 4.0–10.5)
nRBC: 0 % (ref 0.0–0.2)

## 2021-05-26 MED ORDER — OXYCODONE-ACETAMINOPHEN 5-325 MG PO TABS: 1.0000 | ORAL_TABLET | Freq: Three times a day (TID) | ORAL | 0 refills | Status: DC | PRN

## 2021-05-26 MED ORDER — ONDANSETRON 4 MG PO TBDP: 4.0000 mg | ORAL_TABLET | Freq: Three times a day (TID) | ORAL | 0 refills | Status: DC | PRN

## 2021-05-26 MED ORDER — METOCLOPRAMIDE HCL 5 MG/ML IJ SOLN
10.0000 mg | Freq: Once | INTRAMUSCULAR | Status: AC
  Administered 2021-05-26: 10 mg via INTRAVENOUS
  Filled 2021-05-26: qty 2

## 2021-05-26 MED ORDER — SODIUM CHLORIDE 0.9 % IV BOLUS
1000.0000 mL | Freq: Once | INTRAVENOUS | Status: AC
  Administered 2021-05-26: 1000 mL via INTRAVENOUS

## 2021-05-26 MED ORDER — IOHEXOL 350 MG/ML SOLN: 100.0000 mL | Freq: Once | INTRAVENOUS | Status: DC | PRN

## 2021-05-26 MED ORDER — HYDROMORPHONE HCL 1 MG/ML IJ SOLN
1.0000 mg | Freq: Once | INTRAMUSCULAR | Status: AC
  Administered 2021-05-26: 1 mg via INTRAVENOUS
  Filled 2021-05-26: qty 1

## 2021-05-26 MED ORDER — KETOROLAC TROMETHAMINE 30 MG/ML IJ SOLN
30.0000 mg | Freq: Once | INTRAMUSCULAR | Status: AC
  Administered 2021-05-26: 30 mg via INTRAVENOUS
  Filled 2021-05-26: qty 1

## 2021-05-26 NOTE — ED Triage Notes (Signed)
Pt reports with right flank pain that started 2 weeks ago but is more severe today. Pt reports having stones in both kidney that his doctor has been monitoring over the years.  ?

## 2021-05-26 NOTE — ED Notes (Signed)
Pt was screaming in pain and after I explained to pt that he cannot keep screaming loudly, pt stated understanding and stopped. Pt states that he has barely urinated all day today. When I asked if pt feels like his bladder is full, pt denies. ?

## 2021-05-26 NOTE — ED Notes (Signed)
Pt states that pain is intermittently returning, but remains tolerable at this time. Pt states no further needs. ?

## 2021-05-26 NOTE — Discharge Instructions (Addendum)

## 2021-05-26 NOTE — ED Provider Triage Note (Signed)
Emergency Medicine Provider Triage Evaluation Note ? ?Ricardo Orr , a 66 y.o. male  was evaluated in triage.  Pt complains of right flank pain.  Diagnosed with a kidney stone a week ago however this pain today started acutely.  Says he has a history of kidney stones and this is one of the worst pains he has had.  Is still urinating, last urination 3 hours ago, without blood. ? ?Review of Systems  ?Positive: Flank pain ?Negative:  ? ?Physical Exam  ?BP (!) 149/108 (BP Location: Left Arm)   Pulse (!) 115   Resp (!) 24   SpO2 100%  ?Gen:   Awake, no distress   ?Resp:  Normal effort  ?MSK:   Moves extremities without difficulty  ?Other:  Writhing in pain ? ?Medical Decision Making  ?Medically screening exam initiated at 8:33 PM.  Appropriate orders placed.  Maksim Peregoy was informed that the remainder of the evaluation will be completed by another provider, this initial triage assessment does not replace that evaluation, and the importance of remaining in the ED until their evaluation is complete. ? ?IV started.  Toradol and Reglan also ordered.  CT ordered ?  ?Saddie Benders, PA-C ?05/26/21 2035 ? ?

## 2021-05-26 NOTE — ED Provider Notes (Signed)
?[facility]-EMERGENCY DEPT ?Provider Note ? ? ?CSN: [id number] ?Arrival date & time: 05/26/21  2015 ? ?  ? ?History ? ?Chief Complaint  ?Patient presents with  ? Nephrolithiasis  ? ? ?Ricardo Orr is a 66 y.o. male. ? ?HPI ?Patient presents with severe flank pain.  Began tonight.  However has been recently seen by Dr. Marlou Porch from urology.  Has large bilateral renal stones.  Plans for outpatient treatment but large enough will likely not pass on its own.  Pain began severe today but has had hematuria for a while.  No fevers.  Yelling out in pain at this point. ?  ? ?Home Medications ?Prior to Admission medications   ?Medication Sig Start Date End Date Taking? Authorizing Provider  ?ondansetron (ZOFRAN-ODT) 4 MG disintegrating tablet Take 1 tablet (4 mg total) by mouth every 8 (eight) hours as needed for nausea or vomiting. 05/26/21  Yes Benjiman Core, MD  ?oxyCODONE-acetaminophen (PERCOCET/ROXICET) 5-325 MG tablet Take 1-2 tablets by mouth every 8 (eight) hours as needed for severe pain. 05/26/21  Yes Benjiman Core, MD  ?dutasteride (AVODART) 0.5 MG capsule  06/15/19   [provider]  ?latanoprost (XALATAN) 0.005 % ophthalmic solution  06/15/19   [provider]  ?sildenafil (REVATIO) 20 MG tablet TAKE 1 TO 5 TABLETS BY MOUTH 30 MINUTES PRIOR TO ACTIVITY 01/21/19   [provider]  ?   ? ?Allergies    ?Patient has no known allergies.   ? ?Review of Systems   ?Review of Systems  ?Constitutional:  Negative for activity change.  ?Genitourinary:  Positive for flank pain and hematuria.  ? ?Physical Exam ?Updated Vital Signs ?BP 123/76   Pulse 83   Temp 98.3 ?F (36.8 ?C) (Oral)   Resp 16   SpO2 98%  ?Physical Exam ?Vitals and nursing note reviewed.  ?HENT:  ?   Head: Atraumatic.  ?Eyes:  ?   Pupils: Pupils are equal, round, and reactive to light.  ?Cardiovascular:  ?   Rate and Rhythm: Tachycardia present.  ?Pulmonary:  ?   Breath sounds: No wheezing or rhonchi.   ?Genitourinary: ?   Comments: CVA tenderness on right. ?Musculoskeletal:  ?   Cervical back: Neck supple.  ?Skin: ?   Capillary Refill: Capillary refill takes less than 2 seconds.  ?Neurological:  ?   Mental Status: He is alert and oriented to person, place, and time.  ? ? ?ED Results / Procedures / Treatments   ?Labs ?(all labs ordered are listed, but only abnormal results are displayed) ?Labs Reviewed  ?BASIC METABOLIC PANEL - Abnormal; Notable for the following components:  ?    Result Value  ? CO2 21 (*)   ? Glucose, Bld 132 (*)   ? Creatinine, Ser 1.32 (*)   ? All other components within normal limits  ?CBC - Abnormal; Notable for the following components:  ? WBC 14.4 (*)   ? All other components within normal limits  ?URINALYSIS, ROUTINE W REFLEX MICROSCOPIC  ? ? ?EKG ?None ? ?Radiology ?DG Abdomen 1 View ? ?Result Date: 05/26/2021 ?CLINICAL DATA:  Flank pain EXAM: ABDOMEN - 1 VIEW COMPARISON:  03/16/2021.  CT 05/21/2021 FINDINGS: Bilateral nephrolithiasis. Right renal pelvic stone again noted measuring 12 mm. Moderate stool burden throughout the colon. No organomegaly, free air or bowel obstruction. IMPRESSION: Bilateral nephrolithiasis. 12 mm right renal pelvic stone again noted. Moderate stool burden. Electronically Signed   By: Charlett Nose M.D.   On: 05/26/2021 21:21  ? ?US  Renal ? ?Result Date: 05/26/2021 ?CLINICAL DATA:  Right flank pain EXAM: RENAL / URINARY TRACT ULTRASOUND COMPLETE COMPARISON:  Radiograph 05/26/2021, CT 05/21/2021 FINDINGS: Right Kidney: Renal measurements: 11.5 x 5.2 x 5.4 cm = volume: 163.3 mL. Cortex is slightly echogenic. Mild right hydronephrosis. 5 mm shadowing stone lower pole right kidney. 12 mm shadowing stone at the right UPJ. Left Kidney: Renal measurements: 10.7 x 5.8 x 6 cm = volume: 195.1 mL. Shadowing stone at the midpole measuring 10 mm. Simple appearing cysts measuring up to 16 mm at the lower pole, no follow-up imaging is recommended. Bladder: Appears normal for  degree of bladder distention. Other: Enlarged prostate with mass effect on the bladder. IMPRESSION: 1. Mild right hydronephrosis, suspect secondary to a 12 mm stone at the right UPJ. 2. Bilateral kidney stones 3. Markedly enlarged prostate Electronically Signed   By: Jasmine Pang M.D.   On: 05/26/2021 23:26   ? ?Procedures ?Procedures  ? ? ?Medications Ordered in ED ?Medications  ?sodium chloride 0.9 % bolus 1,000 mL (has no administration in time range)  ?ketorolac (TORADOL) 30 MG/ML injection 30 mg (30 mg Intravenous Given 05/26/21 2049)  ?metoCLOPramide (REGLAN) injection 10 mg (10 mg Intravenous Given 05/26/21 2049)  ?HYDROmorphone (DILAUDID) injection 1 mg (1 mg Intravenous Given 05/26/21 2106)  ? ? ?ED Course/ Medical Decision Making/ A&P ?  ?                        ?Medical Decision Making ?Amount and/or Complexity of Data Reviewed ?Radiology: ordered. ? ?Risk ?Prescription drug management. ? ? ?Patient with severe flank pain.  History of kidney stones with recent CT.  I reviewed the CT from about 4 days ago.  KUB also done and showed renal stone likely in similar position.  Urine still pending.  Pain much improved after Dilaudid Toradol and Reglan.  Discussed with Dr. Marlou Porch who happens to be the patient's urologist also.  Could get a stent tonight if needed, however cannot get the definitive treatment that he would need.  Recommends keeping him hydrated.  Giving pain medicines.  We will get ultrasound to look for hydronephrosis.  Can follow-up as an outpatient if pain continues to be improved.  Care turned over to Dr. Adela Lank ? ? ? ? ? ? ? ?Final Clinical Impression(s) / ED Diagnoses ?Final diagnoses:  ?Kidney stone  ? ? ?Rx / DC Orders ?ED Discharge Orders   ? ?      Ordered  ?  oxyCODONE-acetaminophen (PERCOCET/ROXICET) 5-325 MG tablet  Every 8 hours PRN       ? 05/26/21 2333  ?  ondansetron (ZOFRAN-ODT) 4 MG disintegrating tablet  Every 8 hours PRN       ? 05/26/21 2333  ? ?  ?  ? ?  ? ? ?  ?Benjiman Core, MD ?05/26/21 2333 ? ?

## 2021-05-27 LAB — URINALYSIS, ROUTINE W REFLEX MICROSCOPIC
Bilirubin Urine: NEGATIVE
Glucose, UA: NEGATIVE mg/dL
Ketones, ur: 20 mg/dL — AB
Nitrite: NEGATIVE
Protein, ur: 100 mg/dL — AB
RBC / HPF: >50 RBC/hpf — ABNORMAL HIGH (ref 0–5)
Specific Gravity, Urine: 1.017 (ref 1.005–1.030)
pH: 5 (ref 5.0–8.0)

## 2021-05-27 MED ORDER — MORPHINE SULFATE 15 MG PO TABS
7.5000 mg | ORAL_TABLET | ORAL | 0 refills | Status: DC | PRN
Start: 2021-05-27 — End: 2021-06-26

## 2021-05-27 MED ORDER — MORPHINE SULFATE 15 MG PO TABS: 7.5000 mg | ORAL_TABLET | ORAL | 0 refills | Status: DC | PRN

## 2021-05-27 MED ORDER — HYDROMORPHONE HCL 1 MG/ML IJ SOLN
1.0000 mg | Freq: Once | INTRAMUSCULAR | Status: AC
  Administered 2021-05-27: 1 mg via INTRAVENOUS
  Filled 2021-05-27: qty 1

## 2021-05-27 MED ORDER — ONDANSETRON 4 MG PO TBDP: 4.0000 mg | ORAL_TABLET | Freq: Three times a day (TID) | ORAL | 0 refills | Status: DC | PRN

## 2021-05-27 NOTE — ED Notes (Signed)
Discharge instructions reviewed, questions answered. Rx education provided. Pt states understanding and no further questions. Pt ambulatory with steady gait upon discharge. No s/s of distress noted. ? ?

## 2021-05-27 NOTE — ED Provider Notes (Signed)
I received the patient in signout from Dr. Rubin Payor, briefly the patient is a 66 year old male with recurrent flank pain that is presumed to be from a fairly large intrarenal kidney stone.  The patient is feeling a bit better plan for UA and renal ultrasound if no significant finding then plan to follow-up with urology in the office. ? ?UA resulted and does not show obvious signs of infection.  Renal ultrasound with mild hydro-.  Will have him follow-up with urology as previously planned. ?  Melene Plan, DO ?05/27/21 0057 ? ?

## 2021-06-03 DIAGNOSIS — N200 Calculus of kidney: Secondary | ICD-10-CM | POA: Diagnosis not present

## 2021-06-11 DIAGNOSIS — N200 Calculus of kidney: Secondary | ICD-10-CM | POA: Diagnosis not present

## 2021-06-11 DIAGNOSIS — N390 Urinary tract infection, site not specified: Secondary | ICD-10-CM | POA: Diagnosis not present

## 2021-06-11 DIAGNOSIS — B9689 Other specified bacterial agents as the cause of diseases classified elsewhere: Secondary | ICD-10-CM | POA: Diagnosis not present

## 2021-06-17 DIAGNOSIS — H43811 Vitreous degeneration, right eye: Secondary | ICD-10-CM | POA: Diagnosis not present

## 2021-06-17 DIAGNOSIS — H25812 Combined forms of age-related cataract, left eye: Secondary | ICD-10-CM | POA: Diagnosis not present

## 2021-06-17 DIAGNOSIS — H2511 Age-related nuclear cataract, right eye: Secondary | ICD-10-CM | POA: Diagnosis not present

## 2021-06-17 DIAGNOSIS — H401131 Primary open-angle glaucoma, bilateral, mild stage: Secondary | ICD-10-CM | POA: Diagnosis not present

## 2021-06-22 DIAGNOSIS — N3000 Acute cystitis without hematuria: Secondary | ICD-10-CM | POA: Diagnosis not present

## 2021-06-26 ENCOUNTER — Encounter: Payer: Self-pay | Admitting: Family Medicine

## 2021-06-26 ENCOUNTER — Ambulatory Visit (INDEPENDENT_AMBULATORY_CARE_PROVIDER_SITE_OTHER): Payer: BC Managed Care – PPO | Admitting: Family Medicine

## 2021-06-26 VITALS — BP 102/80 | HR 76 | Temp 97.9°F | Ht 71.0 in | Wt 150.4 lb

## 2021-06-26 DIAGNOSIS — N401 Enlarged prostate with lower urinary tract symptoms: Secondary | ICD-10-CM

## 2021-06-26 DIAGNOSIS — R42 Dizziness and giddiness: Secondary | ICD-10-CM

## 2021-06-26 DIAGNOSIS — N200 Calculus of kidney: Secondary | ICD-10-CM | POA: Diagnosis not present

## 2021-06-26 DIAGNOSIS — R55 Syncope and collapse: Secondary | ICD-10-CM | POA: Diagnosis not present

## 2021-06-26 NOTE — Progress Notes (Signed)
   Subjective:    Patient ID: Ricardo Orr, male    DOB: 1955-07-30, 66 y.o.   MRN: [id number]  HPI Here for intermittent spells of lightheadedness and one brief spell where he passed out at home and fell. He has had one or two spells of what sounds like vertigo as well , where the room seemed to be spinning. Most of his symptoms occur when he stands up from sitting or lying, and they go away in 10-20 seconds. No headache or SOB or palpitations. He went to the ED on 05-26-21 for back pain and was found to be passing a right sided kidney stone. He was seen by Dr. Marlou Porch and he was started on Tamsulosin. He later had a right ureteroscopy with stent placement. He had a Foley in place for awhile. This was removed when he developed a UTI that was treated with Cipro. The plan is to have a left ureteroscopy sometime soon.    Review of Systems  Constitutional: Negative.   HENT: Negative.    Respiratory: Negative.    Cardiovascular: Negative.   Gastrointestinal: Negative.   Genitourinary: Negative.   Neurological:  Positive for dizziness and light-headedness.      Objective:   Physical Exam Constitutional:      Appearance: Normal appearance.  HENT:     Right Ear: Tympanic membrane, ear canal and external ear normal.     Left Ear: Tympanic membrane, ear canal and external ear normal.     Nose: Nose normal.     Mouth/Throat:     Pharynx: Oropharynx is clear.  Eyes:     Conjunctiva/sclera: Conjunctivae normal.  Cardiovascular:     Rate and Rhythm: Normal rate and regular rhythm.     Pulses: Normal pulses.     Heart sounds: Normal heart sounds.  Pulmonary:     Effort: Pulmonary effort is normal.     Breath sounds: Normal breath sounds.  Lymphadenopathy:     Cervical: No cervical adenopathy.  Neurological:     General: No focal deficit present.     Mental Status: He is alert and oriented to person, place, and time.     Cranial Nerves: No cranial nerve deficit.     Motor: No weakness.      Gait: Gait normal.          Assessment & Plan:  He has having spells of lightheadedness which may be due to orthostatic events caused by the Tamsulosin. He will stop the Tamsulosin and drink plenty of fluids. Follow up as needed. We spent a total of (31   ) minutes reviewing records and discussing these issues.   Gershon Crane, MD

## 2021-07-08 DIAGNOSIS — B9620 Unspecified Escherichia coli [E. coli] as the cause of diseases classified elsewhere: Secondary | ICD-10-CM | POA: Diagnosis not present

## 2021-07-08 DIAGNOSIS — N390 Urinary tract infection, site not specified: Secondary | ICD-10-CM | POA: Diagnosis not present

## 2021-07-15 DIAGNOSIS — N200 Calculus of kidney: Secondary | ICD-10-CM | POA: Diagnosis not present

## 2021-07-17 DIAGNOSIS — R300 Dysuria: Secondary | ICD-10-CM | POA: Diagnosis not present

## 2021-07-17 DIAGNOSIS — R3915 Urgency of urination: Secondary | ICD-10-CM | POA: Diagnosis not present

## 2021-07-17 DIAGNOSIS — R3914 Feeling of incomplete bladder emptying: Secondary | ICD-10-CM | POA: Diagnosis not present

## 2021-07-17 DIAGNOSIS — R350 Frequency of micturition: Secondary | ICD-10-CM | POA: Diagnosis not present

## 2021-07-17 DIAGNOSIS — N200 Calculus of kidney: Secondary | ICD-10-CM | POA: Diagnosis not present

## 2021-07-28 DIAGNOSIS — J343 Hypertrophy of nasal turbinates: Secondary | ICD-10-CM | POA: Diagnosis not present

## 2021-07-28 DIAGNOSIS — J310 Chronic rhinitis: Secondary | ICD-10-CM | POA: Diagnosis not present

## 2021-07-28 DIAGNOSIS — H903 Sensorineural hearing loss, bilateral: Secondary | ICD-10-CM | POA: Diagnosis not present

## 2021-07-28 DIAGNOSIS — J324 Chronic pansinusitis: Secondary | ICD-10-CM | POA: Diagnosis not present

## 2022-01-05 ENCOUNTER — Encounter: Payer: Self-pay | Admitting: Family Medicine

## 2022-01-05 ENCOUNTER — Ambulatory Visit (INDEPENDENT_AMBULATORY_CARE_PROVIDER_SITE_OTHER): Payer: Commercial Managed Care - HMO | Admitting: Family Medicine

## 2022-01-05 VITALS — BP 118/80 | HR 88 | Temp 97.5°F | Ht 70.47 in | Wt 154.0 lb

## 2022-01-05 DIAGNOSIS — Z0000 Encounter for general adult medical examination without abnormal findings: Secondary | ICD-10-CM

## 2022-01-05 DIAGNOSIS — E875 Hyperkalemia: Secondary | ICD-10-CM

## 2022-01-05 LAB — CBC WITH DIFFERENTIAL/PLATELET
Basophils Absolute: 0.1 10*3/uL (ref 0.0–0.1)
Basophils Relative: 1.1 % (ref 0.0–3.0)
Eosinophils Absolute: 0.1 10*3/uL (ref 0.0–0.7)
Eosinophils Relative: 1.4 % (ref 0.0–5.0)
HCT: 43.7 % (ref 39.0–52.0)
Hemoglobin: 15.1 g/dL (ref 13.0–17.0)
Lymphocytes Relative: 20.1 % (ref 12.0–46.0)
Lymphs Abs: 1.1 10*3/uL (ref 0.7–4.0)
MCHC: 34.4 g/dL (ref 30.0–36.0)
MCV: 92.7 fl (ref 78.0–100.0)
Monocytes Absolute: 0.5 10*3/uL (ref 0.1–1.0)
Monocytes Relative: 10 % (ref 3.0–12.0)
Neutro Abs: 3.5 10*3/uL (ref 1.4–7.7)
Neutrophils Relative %: 67.4 % (ref 43.0–77.0)
Platelets: 204 10*3/uL (ref 150.0–400.0)
RBC: 4.72 Mil/uL (ref 4.22–5.81)
RDW: 13.9 % (ref 11.5–15.5)
WBC: 5.2 10*3/uL (ref 4.0–10.5)

## 2022-01-05 LAB — LIPID PANEL
Cholesterol: 212 mg/dL — ABNORMAL HIGH (ref 0–200)
HDL: 86.1 mg/dL (ref >39.00)
LDL Cholesterol: 114 mg/dL — ABNORMAL HIGH (ref 0–99)
NonHDL: 125.71
Total CHOL/HDL Ratio: 2
Triglycerides: 59 mg/dL (ref 0.0–149.0)
VLDL: 11.8 mg/dL (ref 0.0–40.0)

## 2022-01-05 LAB — BASIC METABOLIC PANEL
BUN: 24 mg/dL — ABNORMAL HIGH (ref 6–23)
CO2: 30 mEq/L (ref 19–32)
Calcium: 10 mg/dL (ref 8.4–10.5)
Chloride: 102 mEq/L (ref 96–112)
Creatinine, Ser: 1.12 mg/dL (ref 0.40–1.50)
GFR: 69.23 mL/min (ref >60.00)
Glucose, Bld: 87 mg/dL (ref 70–99)
Potassium: 5.6 mEq/L — ABNORMAL HIGH (ref 3.5–5.1)
Sodium: 138 mEq/L (ref 135–145)

## 2022-01-05 LAB — HEPATIC FUNCTION PANEL
ALT: 13 U/L (ref 0–53)
AST: 18 U/L (ref 0–37)
Albumin: 4.8 g/dL (ref 3.5–5.2)
Alkaline Phosphatase: 60 U/L (ref 39–117)
Bilirubin, Direct: 0.2 mg/dL (ref 0.0–0.3)
Total Bilirubin: 1.6 mg/dL — ABNORMAL HIGH (ref 0.2–1.2)
Total Protein: 7.6 g/dL (ref 6.0–8.3)

## 2022-01-05 LAB — TSH: TSH: 1.32 u[IU]/mL (ref 0.35–5.50)

## 2022-01-05 NOTE — Progress Notes (Signed)
Established Patient Office Visit  Subjective   Patient ID: Ricardo Orr, male    DOB: 11-12-1955  Age: 66 y.o. MRN: [id number]  No chief complaint on file.   HPI   Ricardo Orr is seen for physical exam.  He has history of recurrent calcium oxalate kidney stones, BPH, elevated PSA and is followed by urology.  He states he is scheduled to get cataract surgery in January in both eyes.  Has had some hearing loss and is followed by ENT.  Very health-conscious.  He runs several times per week also cycles and walks for exercise.  Maintenance reviewed:  -Flu vaccine already given -Had Tdap 2019 -Colonoscopy due 2031 -Shingrix already completed -Hepatitis C screen previously negative  Social history-married and has 3 children including 1 daughter and 2 sons.  His daughter lives here and she has 2 children ages 67 and 56.  His sons live in [place] and [place].  He is retired from [employer].  Undecided whether he will go back to any kind of work versus continued retirement.  Smoked only briefly and quit over 30 years ago.  Only occasional alcohol use.   Family history-reviewed.  Parents both had dementia mid 90s.  He does relate that his father was diagnosed with Paget's disease of bone in his 73s.  He has a brother who has history of what sounds like cervical spondylosis with cord compression.  His brother died earlier this year at 52 presumably from sepsis following knee injection.  Sister alive and well.  No family history of premature CAD or diabetes.  Past Medical History:  Diagnosis Date   Allergy    Cataract    forming    Chronic kidney disease    chronic kidney stones-    Elevated PSA    Glaucoma    Past Surgical History:  Procedure Laterality Date   COLONOSCOPY     11 yrs ago- normal per pt    kidney stone extraction     x2   LITHOTRIPSY     PROSTATE BIOPSY     x3   VASECTOMY      reports that he has quit smoking. He has never used smokeless tobacco. He reports current  alcohol use. He reports that he does not use drugs. family history includes Dementia in his mother; Hearing loss in his father. No Known Allergies  Review of Systems  Constitutional:  Negative for chills, fever, malaise/fatigue and weight loss.  HENT:  Negative for hearing loss.   Eyes:  Negative for blurred vision and double vision.  Respiratory:  Negative for cough and shortness of breath.   Cardiovascular:  Negative for chest pain, palpitations and leg swelling.  Gastrointestinal:  Negative for abdominal pain, blood in stool, constipation and diarrhea.  Genitourinary:  Negative for dysuria.  Skin:  Negative for rash.  Neurological:  Negative for dizziness, speech change, seizures, loss of consciousness and headaches.  Psychiatric/Behavioral:  Negative for depression.       Objective:     BP 118/80 (BP Location: Left Arm, Patient Position: Sitting, Cuff Size: Normal)   Pulse 88   Temp (!) 97.5 F (36.4 C) (Oral)   Ht 5' 10.47" (1.79 m)   Wt 154 lb (69.9 kg)   SpO2 99%   BMI 21.80 kg/m  BP Readings from Last 3 Encounters:  01/05/22 118/80  06/26/21 102/80  05/27/21 127/78   Wt Readings from Last 3 Encounters:  01/05/22 154 lb (69.9 kg)  06/26/21 150 lb 6.4  oz (68.2 kg)  12/30/20 155 lb (70.3 kg)      Physical Exam Vitals reviewed.  Constitutional:      General: He is not in acute distress.    Appearance: He is well-developed.  HENT:     Head: Normocephalic and atraumatic.     Right Ear: External ear normal.     Left Ear: External ear normal.  Eyes:     Conjunctiva/sclera: Conjunctivae normal.     Pupils: Pupils are equal, round, and reactive to light.  Neck:     Thyroid: No thyromegaly.  Cardiovascular:     Rate and Rhythm: Normal rate and regular rhythm.     Heart sounds: Normal heart sounds. No murmur heard. Pulmonary:     Effort: No respiratory distress.     Breath sounds: No wheezing or rales.  Abdominal:     General: Bowel sounds are normal. There  is no distension.     Palpations: Abdomen is soft. There is no mass.     Tenderness: There is no abdominal tenderness. There is no guarding or rebound.  Musculoskeletal:     Cervical back: Normal range of motion and neck supple.  Lymphadenopathy:     Cervical: No cervical adenopathy.  Skin:    Findings: No rash.  Neurological:     Mental Status: He is alert and oriented to person, place, and time.     Cranial Nerves: No cranial nerve deficit.      No results found for any visits on 01/05/22.    The 10-year ASCVD risk score (Arnett DK, et al., 2019) is: 9%    Assessment & Plan:   Problem List Items Addressed This Visit   None Visit Diagnoses     Physical exam    -  Primary   Relevant Orders   Basic metabolic panel   Lipid panel   CBC with Differential/Platelet   TSH   Hepatic function panel     Generally healthy 66 year old male.  He has history of kidney stones and elevated PSA with BPH as above.  Followed by urology.  We discussed the following health maintenance items  -Continue regular aerobic exercise.  Consider more consistent resistance component -Continue annual flu vaccine -Other vaccines up-to-date as above -Colonoscopy up-to-date -He will continue follow-up with urology regarding PSAs  No follow-ups on file.    Evelena Peat, MD

## 2022-01-06 ENCOUNTER — Telehealth: Payer: Self-pay | Admitting: Family Medicine

## 2022-01-06 NOTE — Addendum Note (Signed)
Addended by: Christy Sartorius on: 01/06/2022 03:01 PM   Modules accepted: Orders

## 2022-01-06 NOTE — Telephone Encounter (Signed)
Pt called, returning CMA's call. CMA was unavailable. Pt asked that CMA call back at their earliest convenience. 

## 2022-01-06 NOTE — Telephone Encounter (Signed)
See result note.  

## 2022-01-20 ENCOUNTER — Other Ambulatory Visit: Payer: BC Managed Care – PPO

## 2022-01-20 DIAGNOSIS — E875 Hyperkalemia: Secondary | ICD-10-CM

## 2022-01-20 LAB — BASIC METABOLIC PANEL
BUN: 23 mg/dL (ref 6–23)
CO2: 30 mEq/L (ref 19–32)
Calcium: 10 mg/dL (ref 8.4–10.5)
Chloride: 102 mEq/L (ref 96–112)
Creatinine, Ser: 1.27 mg/dL (ref 0.40–1.50)
GFR: 59.52 mL/min — ABNORMAL LOW (ref >60.00)
Glucose, Bld: 84 mg/dL (ref 70–99)
Potassium: 5.1 mEq/L (ref 3.5–5.1)
Sodium: 138 mEq/L (ref 135–145)

## 2022-01-30 ENCOUNTER — Encounter: Payer: Self-pay | Admitting: Family Medicine

## 2022-02-08 DIAGNOSIS — H2511 Age-related nuclear cataract, right eye: Secondary | ICD-10-CM | POA: Diagnosis not present

## 2022-02-08 DIAGNOSIS — H25812 Combined forms of age-related cataract, left eye: Secondary | ICD-10-CM | POA: Diagnosis not present

## 2022-02-08 DIAGNOSIS — H43811 Vitreous degeneration, right eye: Secondary | ICD-10-CM | POA: Diagnosis not present

## 2022-02-08 DIAGNOSIS — H401131 Primary open-angle glaucoma, bilateral, mild stage: Secondary | ICD-10-CM | POA: Diagnosis not present

## 2022-02-11 DIAGNOSIS — H2512 Age-related nuclear cataract, left eye: Secondary | ICD-10-CM | POA: Diagnosis not present

## 2022-02-11 DIAGNOSIS — H401121 Primary open-angle glaucoma, left eye, mild stage: Secondary | ICD-10-CM | POA: Diagnosis not present

## 2022-02-21 DIAGNOSIS — H2511 Age-related nuclear cataract, right eye: Secondary | ICD-10-CM | POA: Diagnosis not present

## 2022-02-25 DIAGNOSIS — H2511 Age-related nuclear cataract, right eye: Secondary | ICD-10-CM | POA: Diagnosis not present

## 2022-03-11 DIAGNOSIS — Z87442 Personal history of urinary calculi: Secondary | ICD-10-CM | POA: Diagnosis not present

## 2022-03-11 DIAGNOSIS — R972 Elevated prostate specific antigen [PSA]: Secondary | ICD-10-CM | POA: Diagnosis not present

## 2022-03-11 DIAGNOSIS — R350 Frequency of micturition: Secondary | ICD-10-CM | POA: Diagnosis not present

## 2022-03-11 DIAGNOSIS — R3914 Feeling of incomplete bladder emptying: Secondary | ICD-10-CM | POA: Diagnosis not present

## 2022-08-02 DIAGNOSIS — L821 Other seborrheic keratosis: Secondary | ICD-10-CM | POA: Diagnosis not present

## 2022-08-02 DIAGNOSIS — L570 Actinic keratosis: Secondary | ICD-10-CM | POA: Diagnosis not present

## 2022-08-02 DIAGNOSIS — L820 Inflamed seborrheic keratosis: Secondary | ICD-10-CM | POA: Diagnosis not present

## 2022-08-02 DIAGNOSIS — L814 Other melanin hyperpigmentation: Secondary | ICD-10-CM | POA: Diagnosis not present

## 2022-08-02 DIAGNOSIS — L538 Other specified erythematous conditions: Secondary | ICD-10-CM | POA: Diagnosis not present

## 2022-08-02 DIAGNOSIS — D225 Melanocytic nevi of trunk: Secondary | ICD-10-CM | POA: Diagnosis not present

## 2022-08-04 DIAGNOSIS — H43811 Vitreous degeneration, right eye: Secondary | ICD-10-CM | POA: Diagnosis not present

## 2022-08-04 DIAGNOSIS — H401131 Primary open-angle glaucoma, bilateral, mild stage: Secondary | ICD-10-CM | POA: Diagnosis not present

## 2022-10-08 IMAGING — CT CT RENAL STONE PROTOCOL
2 of 4 series · 11 of 46 positions shown, 12 images · non-contrast
Comparison: None.

CLINICAL DATA: Renal stones.



[Series 2: renal stone 5.00 br40 s3 axial · axial · 0.49mm/px · z∈[+1179,+1519]mm · 8 of 82 slices shown, 9 images]
[im 7/82  soft-tissue]
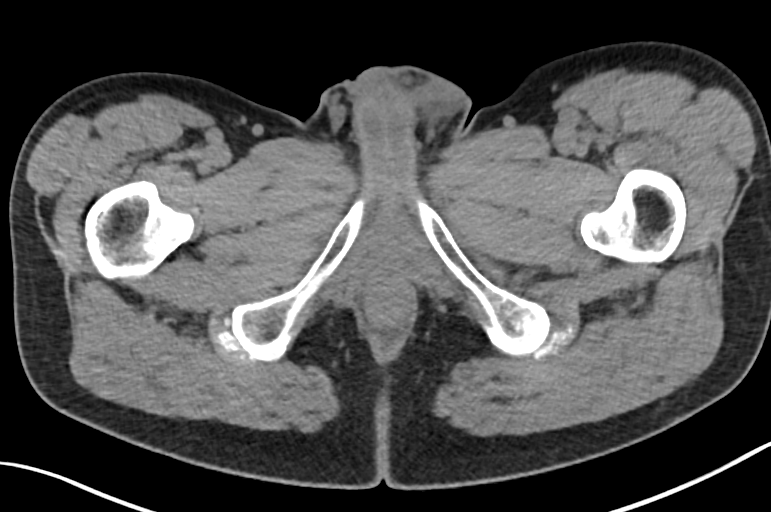
[im 7/82  bone]
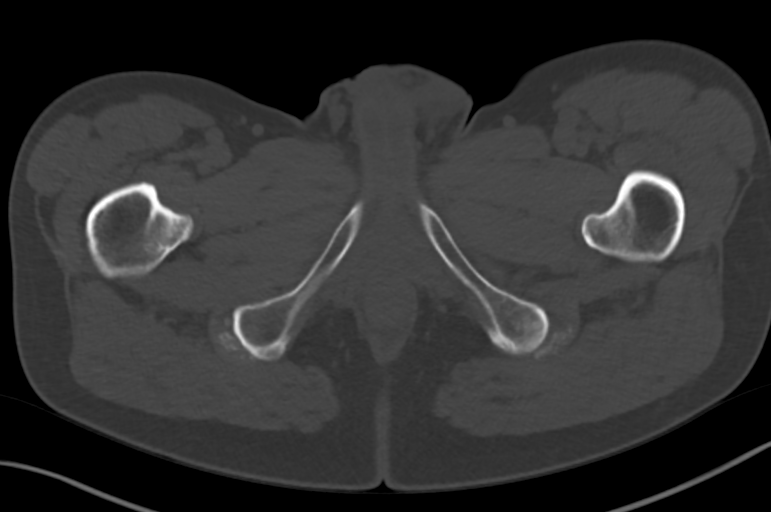
[im 17/82  soft-tissue]
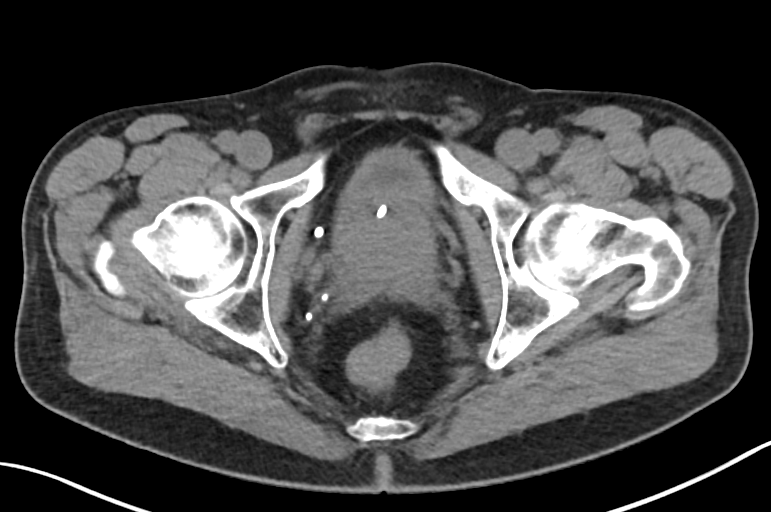
[im 28/82  soft-tissue]
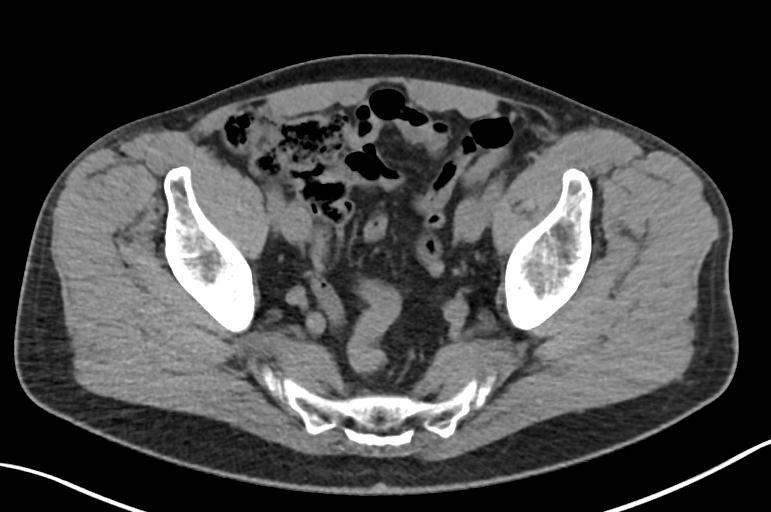
[im 38/82  soft-tissue]
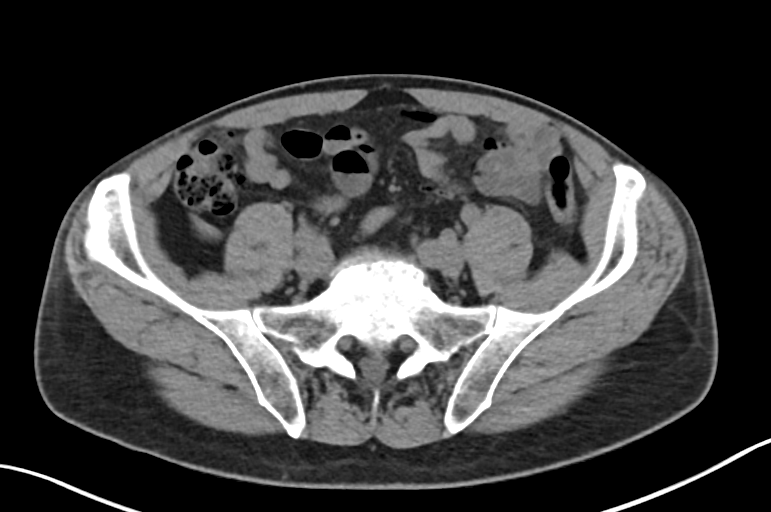
[im 44/82  soft-tissue]
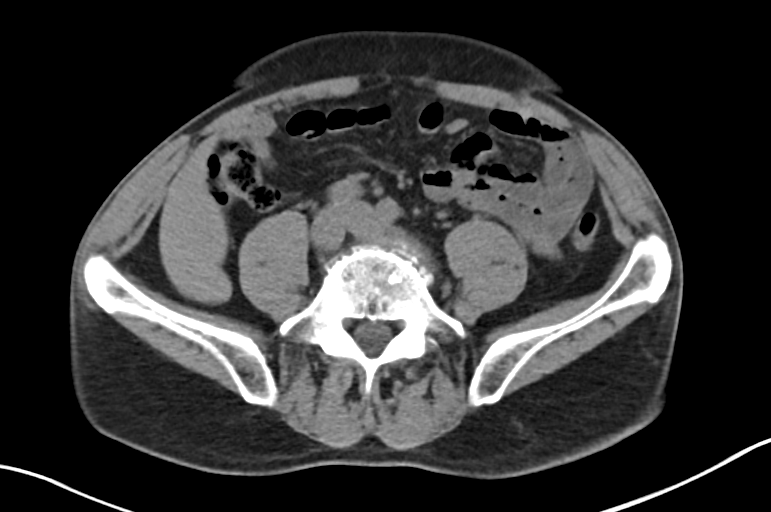
[im 55/82  soft-tissue]
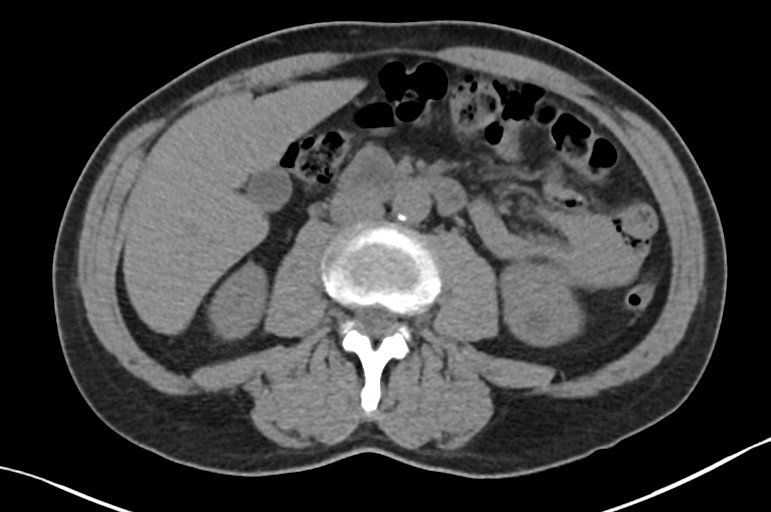
[im 65/82  soft-tissue]
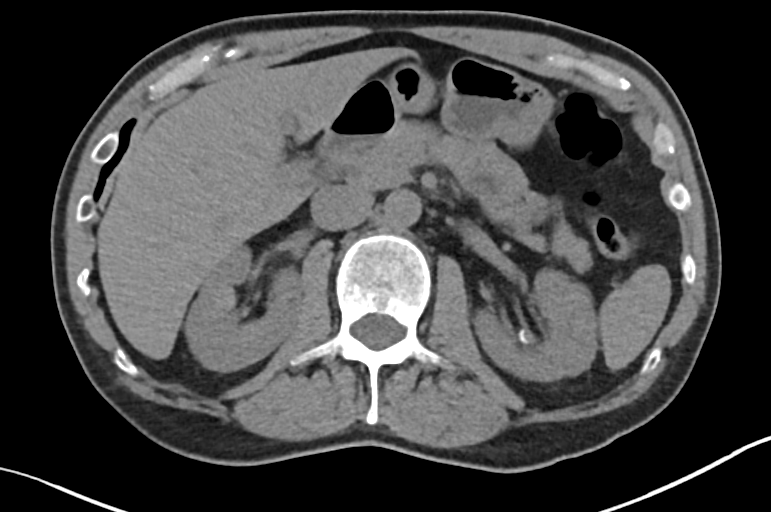
[im 75/82  soft-tissue]
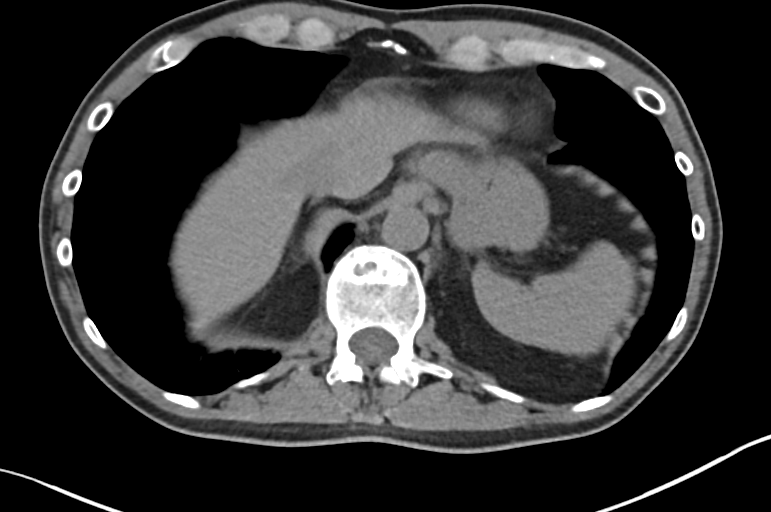

[Series 6: renal stone 2.00 br40 s3 cor · coronal · 0.73mm/px · 3 of 126 slices shown]
[im 42/126  soft-tissue]
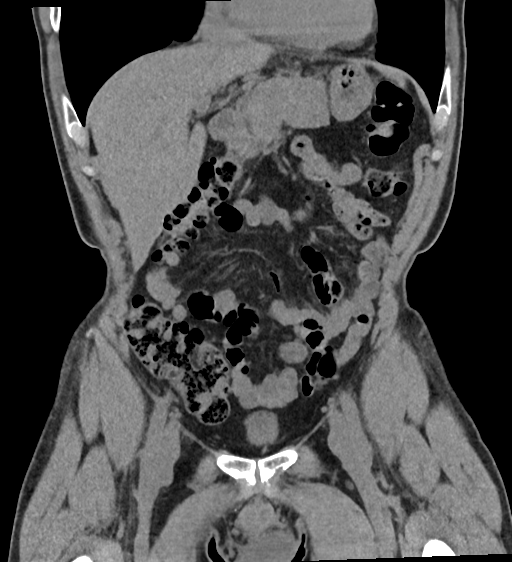
[im 56/126  soft-tissue]
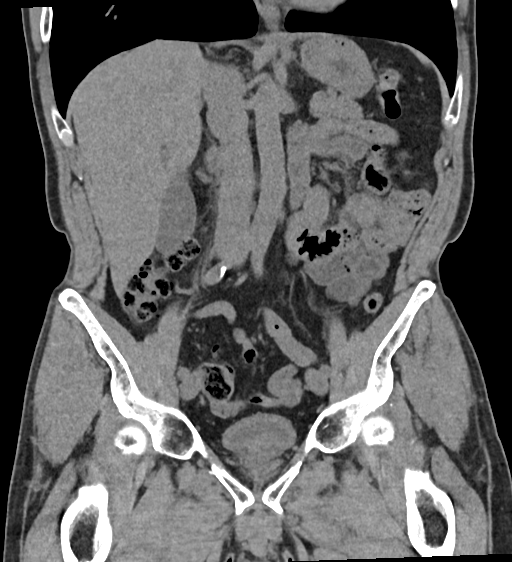
[im 70/126  soft-tissue]
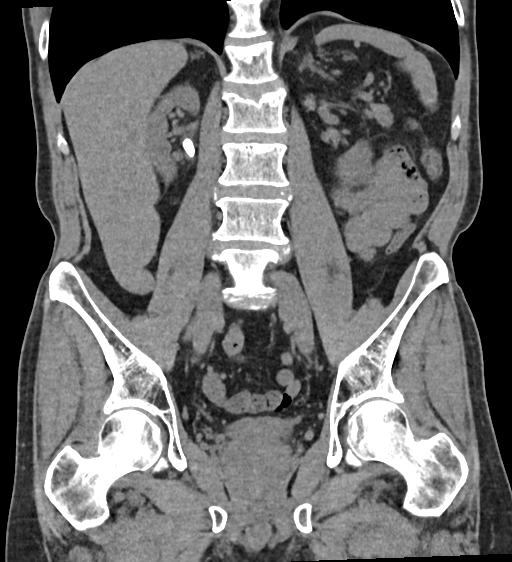

[11 of 46 positions shown; findings below may reference images not displayed]

FINDINGS: Lower chest: Lung bases are clear.

Hepatobiliary: No focal hepatic lesion. No biliary duct dilatation.
Common bile duct is normal.

Pancreas: Pancreas is normal. No ductal dilatation. No pancreatic
inflammation.

Spleen: Normal spleen

Adrenals/urinary tract: Adrenal glands normal.

Large 12 mm calculus at the RIGHT ureteropelvic junction. No RIGHT
hydronephrosis. Smaller 5 mm calculus in the mid RIGHT kidney.

Two coarse LEFT renal calculi measure 4 mm and 10 mm. No LEFT
obstructive uropathy.

No ureterolithiasis.  No bladder calculi.

Stomach/Bowel: Stomach, small bowel, appendix, and cecum are normal.
The colon and rectosigmoid colon are normal.

Vascular/Lymphatic: Abdominal aorta is normal caliber with
atherosclerotic calcification. There is no retroperitoneal or
periportal lymphadenopathy. No pelvic lymphadenopathy.

Reproductive: 49 mm diameter

Other: No free fluid.

Musculoskeletal: No aggressive osseous lesion.
IMPRESSION: 1. Bilateral nephrolithiasis.
2. Large RIGHT renal pelvis calculus without obstruction.
3. No ureterolithiasis or obstructive uropathy.
4. No bladder calculi

## 2022-10-13 IMAGING — US US RENAL
1 series · 15 of 25 positions shown · non-contrast
Comparison: Radiograph 05/26/2021, CT 05/21/2021

CLINICAL DATA: Right flank pain

EXAM:
RENAL / URINARY TRACT ULTRASOUND COMPLETE

[Series 1: us renal mc & wl · 15 of 75 slices shown]
[im 1/75]
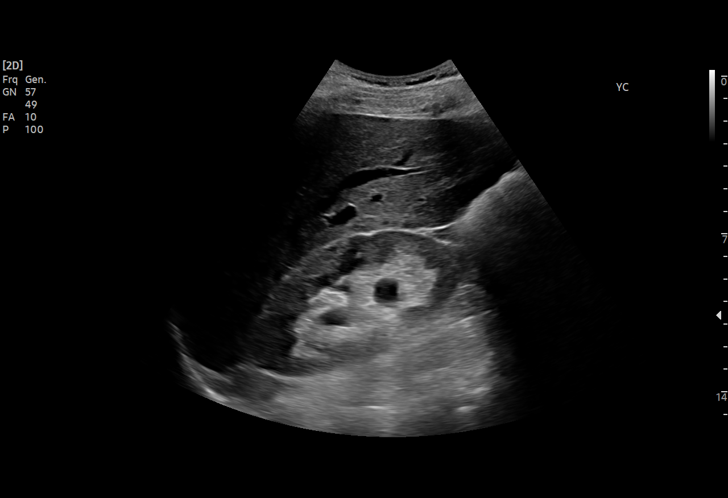
[im 7/75]
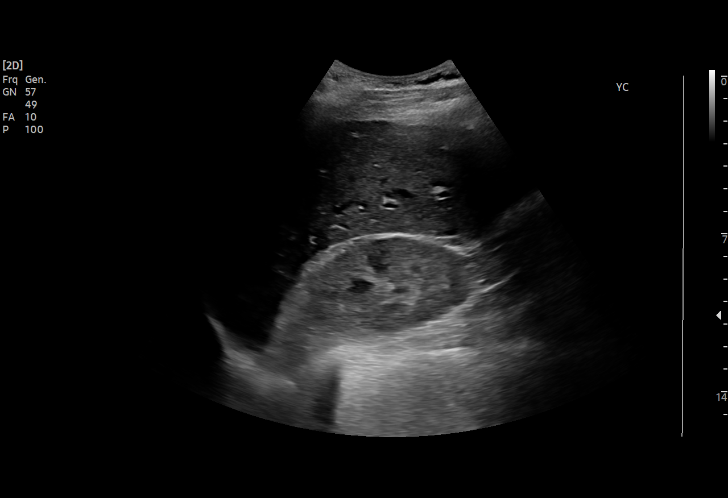
[im 13/75]
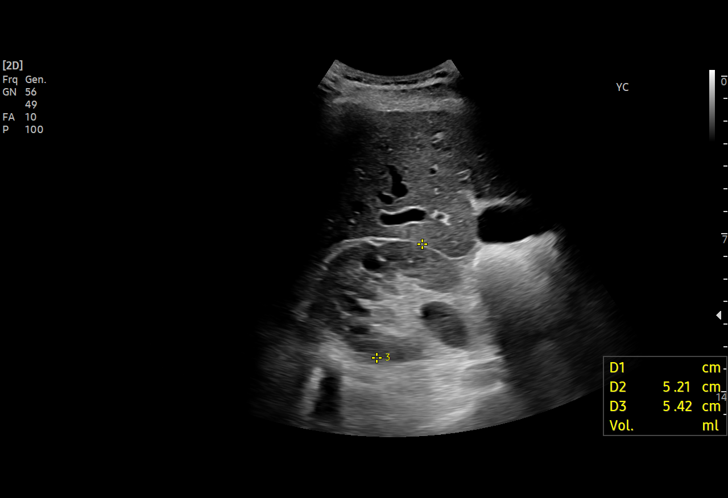
[im 16/75]
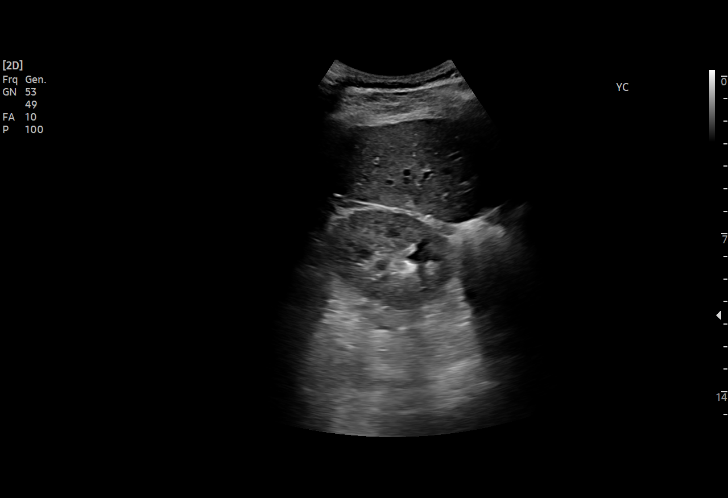
[im 22/75]
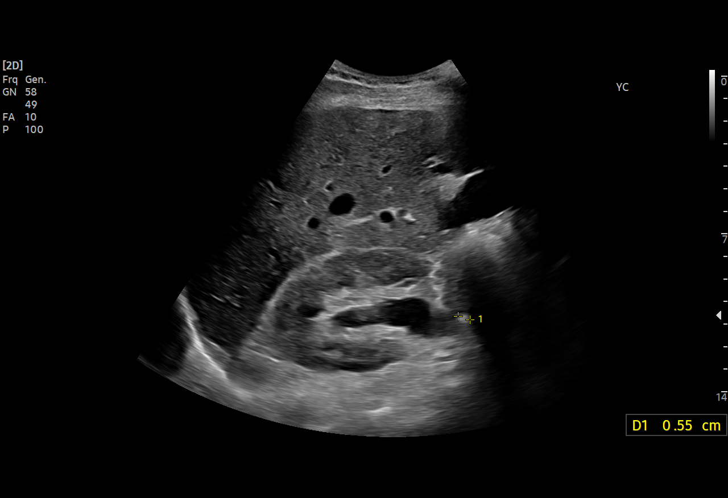
[im 28/75]
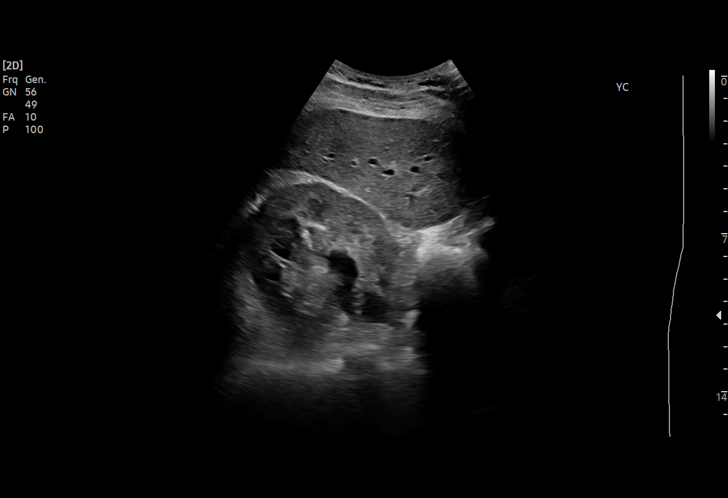
[im 31/75]
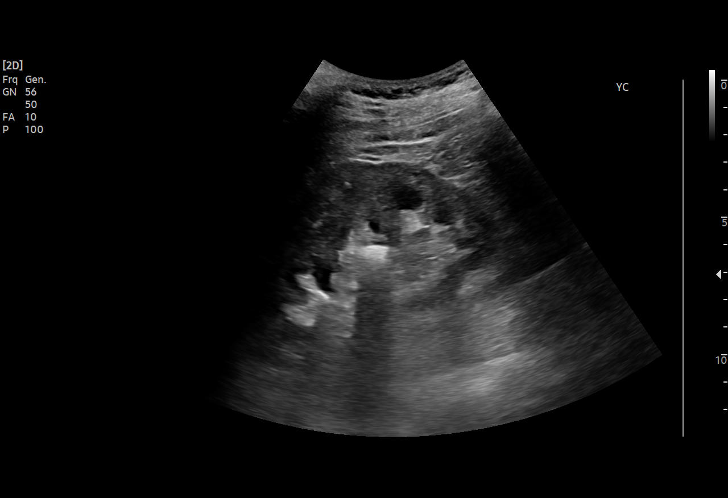
[im 38/75]
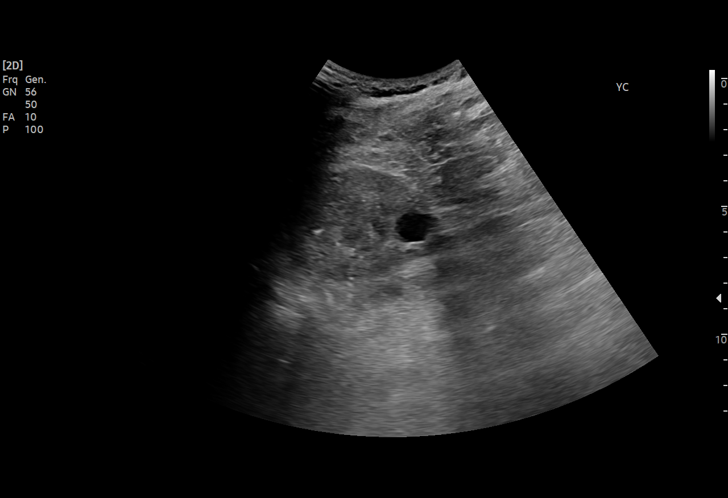
[im 44/75]
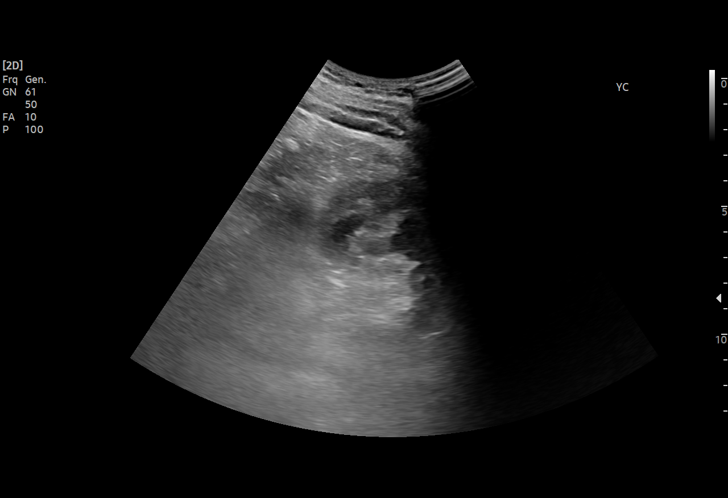
[im 47/75]
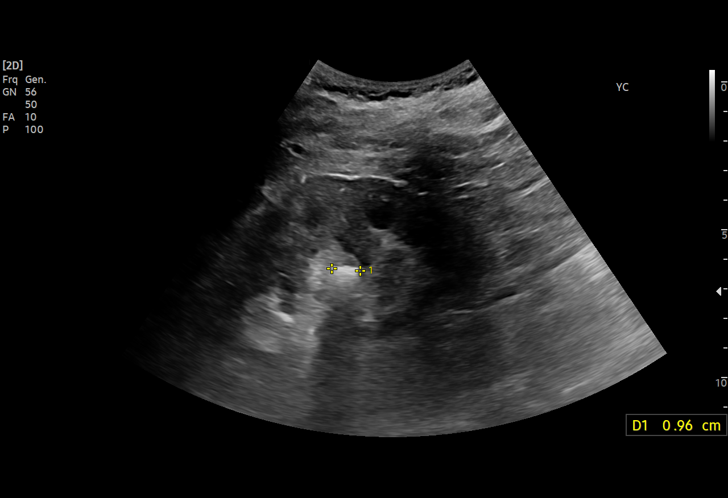
[im 53/75]
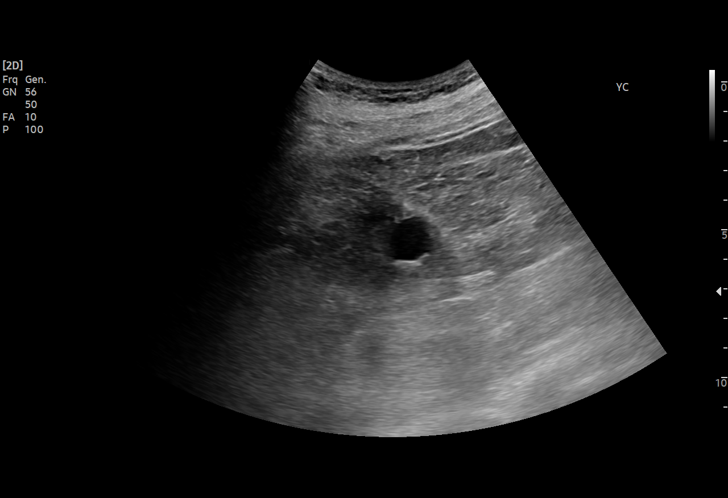
[im 59/75]
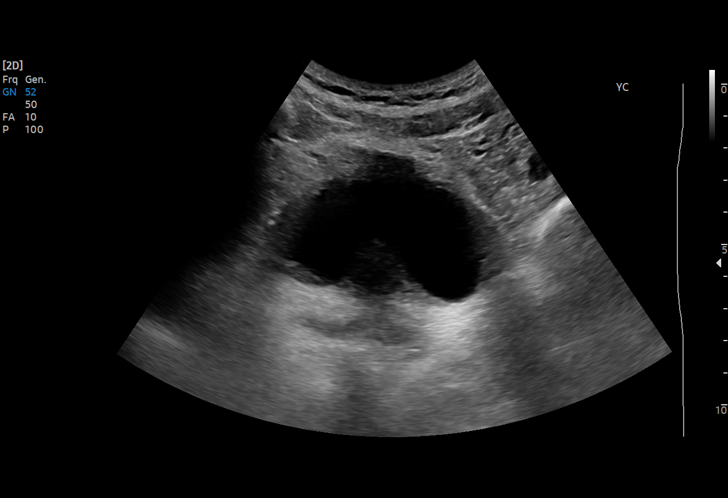
[im 62/75]
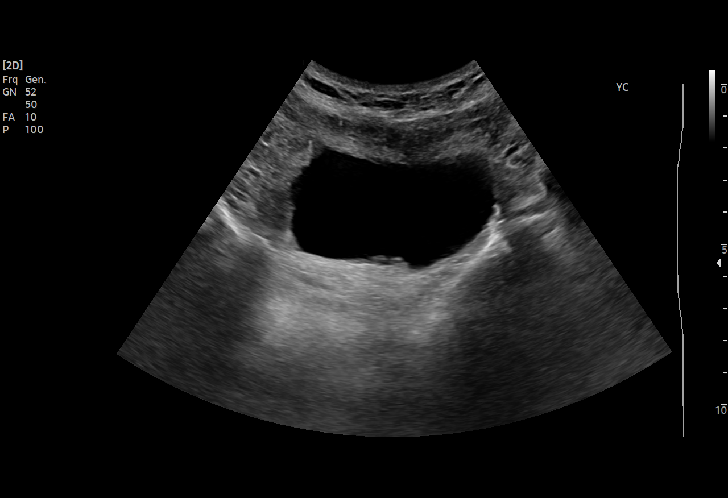
[im 68/75]
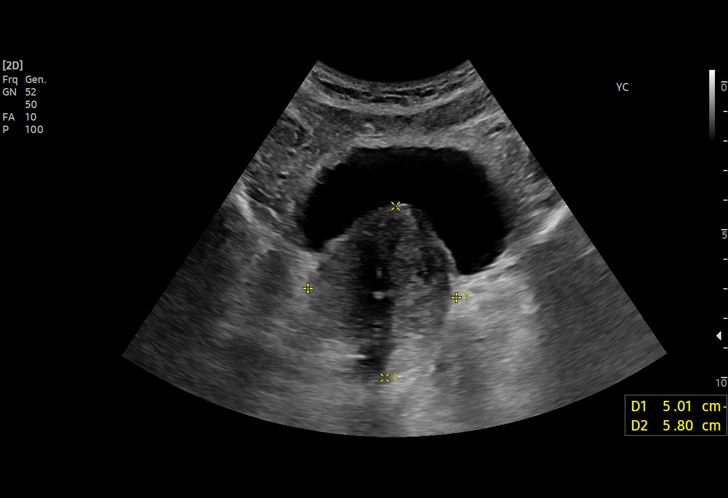
[im 75/75]
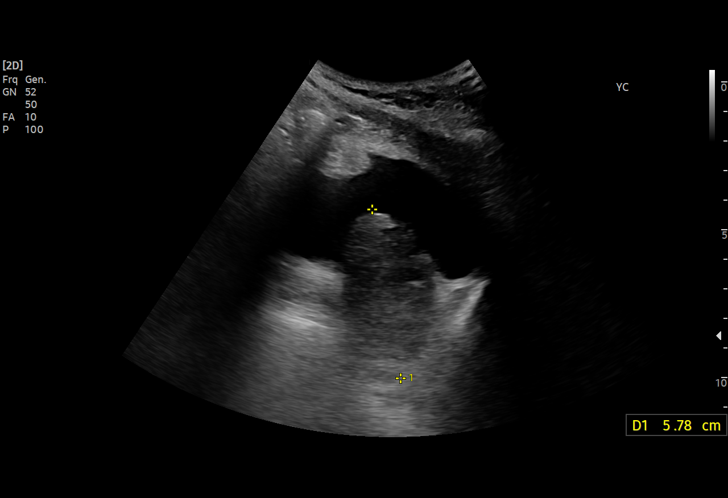

[15 of 25 positions shown; findings below may reference images not displayed]

FINDINGS: Right Kidney:

Renal measurements: 11.5 x 5.2 x 5.4 cm = volume: 163.3 mL. Cortex
is slightly echogenic. Mild right hydronephrosis. 5 mm shadowing
stone lower pole right kidney. 12 mm shadowing stone at the right
UPJ.

Left Kidney:

Renal measurements: 10.7 x 5.8 x 6 cm = volume: 195.1 mL. Shadowing
stone at the midpole measuring 10 mm. Simple appearing cysts
measuring up to 16 mm at the lower pole, no follow-up imaging is
recommended.

Bladder:

Appears normal for degree of bladder distention.

Other:

Enlarged prostate with mass effect on the bladder.
IMPRESSION: 1. Mild right hydronephrosis, suspect secondary to a 12 mm stone at
the right UPJ.
2. Bilateral kidney stones
3. Markedly enlarged prostate

## 2023-01-10 ENCOUNTER — Encounter: Payer: BC Managed Care – PPO | Admitting: Family Medicine

## 2023-01-12 ENCOUNTER — Encounter: Payer: Self-pay | Admitting: Family Medicine

## 2023-01-12 ENCOUNTER — Ambulatory Visit: Payer: BC Managed Care – PPO | Admitting: Family Medicine

## 2023-01-12 VITALS — BP 100/70 | HR 105 | Temp 98.2°F | Ht 70.47 in | Wt 155.3 lb

## 2023-01-12 DIAGNOSIS — Z23 Encounter for immunization: Secondary | ICD-10-CM

## 2023-01-12 DIAGNOSIS — R000 Tachycardia, unspecified: Secondary | ICD-10-CM | POA: Diagnosis not present

## 2023-01-12 DIAGNOSIS — Z0000 Encounter for general adult medical examination without abnormal findings: Secondary | ICD-10-CM | POA: Diagnosis not present

## 2023-01-12 LAB — LIPID PANEL
Cholesterol: 190 mg/dL (ref 0–200)
HDL: 63.5 mg/dL (ref >39.00)
LDL Cholesterol: 110 mg/dL — ABNORMAL HIGH (ref 0–99)
NonHDL: 126.52
Total CHOL/HDL Ratio: 3
Triglycerides: 84 mg/dL (ref 0.0–149.0)
VLDL: 16.8 mg/dL (ref 0.0–40.0)

## 2023-01-12 LAB — CBC WITH DIFFERENTIAL/PLATELET
Basophils Absolute: 0.1 10*3/uL (ref 0.0–0.1)
Basophils Relative: 0.6 % (ref 0.0–3.0)
Eosinophils Absolute: 0.2 10*3/uL (ref 0.0–0.7)
Eosinophils Relative: 1.8 % (ref 0.0–5.0)
HCT: 42.3 % (ref 39.0–52.0)
Hemoglobin: 14.3 g/dL (ref 13.0–17.0)
Lymphocytes Relative: 10.2 % — ABNORMAL LOW (ref 12.0–46.0)
Lymphs Abs: 1 10*3/uL (ref 0.7–4.0)
MCHC: 33.7 g/dL (ref 30.0–36.0)
MCV: 92.8 fL (ref 78.0–100.0)
Monocytes Absolute: 0.8 10*3/uL (ref 0.1–1.0)
Monocytes Relative: 8.9 % (ref 3.0–12.0)
Neutro Abs: 7.4 10*3/uL (ref 1.4–7.7)
Neutrophils Relative %: 78.5 % — ABNORMAL HIGH (ref 43.0–77.0)
Platelets: 179 10*3/uL (ref 150.0–400.0)
RBC: 4.56 Mil/uL (ref 4.22–5.81)
RDW: 13.1 % (ref 11.5–15.5)
WBC: 9.4 10*3/uL (ref 4.0–10.5)

## 2023-01-12 LAB — TSH: TSH: 1.27 u[IU]/mL (ref 0.35–5.50)

## 2023-01-12 LAB — HEPATIC FUNCTION PANEL
ALT: 11 U/L (ref 0–53)
AST: 15 U/L (ref 0–37)
Albumin: 4.4 g/dL (ref 3.5–5.2)
Alkaline Phosphatase: 75 U/L (ref 39–117)
Bilirubin, Direct: 0.2 mg/dL (ref 0.0–0.3)
Total Bilirubin: 1 mg/dL (ref 0.2–1.2)
Total Protein: 6.7 g/dL (ref 6.0–8.3)

## 2023-01-12 LAB — BASIC METABOLIC PANEL
BUN: 22 mg/dL (ref 6–23)
CO2: 28 meq/L (ref 19–32)
Calcium: 9.5 mg/dL (ref 8.4–10.5)
Chloride: 103 meq/L (ref 96–112)
Creatinine, Ser: 1.13 mg/dL (ref 0.40–1.50)
GFR: 68 mL/min (ref >60.00)
Glucose, Bld: 96 mg/dL (ref 70–99)
Potassium: 4.4 meq/L (ref 3.5–5.1)
Sodium: 138 meq/L (ref 135–145)

## 2023-01-12 NOTE — Progress Notes (Signed)
Established Patient Office Visit  Subjective   Patient ID: Ricardo Orr, male    DOB: [birth year]  Age: 67 y.o. MRN: [id number]  Chief Complaint  Patient presents with   Annual Exam    HPI   Ricardo Orr is seen for physical exam.  He has history of BPH, past history of kidney stones, elevated PSA, glaucoma.  Followed regularly by urology.  He went back to work for company out of [place] in May.  Is currently working full-time.  Has been exercising less since going back to work.  Maintained on Flomax and Avodart for his prostate.  He has had some cold-like symptoms over the past few days.  Nasal congestion and cough.  No fever.  Health maintenance reviewed:  Health Maintenance  Topic Date Due   HIV Screening  Never done   Pneumonia Vaccine 35+ Years old (1 of 1 - PCV) Never done   COVID-19 Vaccine (5 - 2023-24 season) 02/07/2023   DTaP/Tdap/Td (2 - Td or Tdap) 11/16/2027   Colonoscopy  06/28/2029   INFLUENZA VACCINE  Completed   Hepatitis C Screening  Completed   Zoster Vaccines- Shingrix  Completed   HPV VACCINES  Aged Out   -Has not had pneumonia vaccine and does consent to getting this today -Flu vaccine already given -Discussed RSV vaccine and he declines.  We reviewed latest guidelines with recommendation for RSV for all over 75 and high risk 60-74  Social history-married with 3 grown children.  He has 1 daughter and 2 sons.  3 grandchildren.  2 sons who previously lived in [place] in [place] and they are here now.  He is retired from [employer] and currently working for company out of [employer].  Went back to work in May.  Smoked briefly in his 44s and 30s and quit over 30 years ago.  Rare alcohol use.  Family history-mother had late onset dementia.  Father had what sounds like Paget's disease of the bone.  Brother died age 74 sepsis complication from the injection.  Sister alive and well.  No diabetes or CAD history  Past Medical History:  Diagnosis Date   Allergy     Cataract    forming    Chronic kidney disease    chronic kidney stones-    Elevated PSA    Glaucoma    Past Surgical History:  Procedure Laterality Date   COLONOSCOPY     11 yrs ago- normal per pt    kidney stone extraction     x2   LITHOTRIPSY     PROSTATE BIOPSY     x3   VASECTOMY      reports that he has quit smoking. He has never used smokeless tobacco. He reports current alcohol use. He reports that he does not use drugs. family history includes Dementia in his mother; Hearing loss in his father. No Known Allergies   Review of Systems  Constitutional:  Negative for chills, fever, malaise/fatigue and weight loss.  HENT:  Positive for congestion. Negative for hearing loss.   Eyes:  Negative for blurred vision and double vision.  Respiratory:  Positive for cough. Negative for shortness of breath.   Cardiovascular:  Negative for chest pain, palpitations and leg swelling.  Gastrointestinal:  Negative for abdominal pain, blood in stool, constipation and diarrhea.  Genitourinary:  Negative for dysuria.  Skin:  Negative for rash.  Neurological:  Negative for dizziness, speech change, seizures, loss of consciousness and headaches.  Psychiatric/Behavioral:  Negative for depression.  Objective:     BP 100/70 (BP Location: Left Arm, Patient Position: Sitting, Cuff Size: Normal)   Pulse (!) 105   Temp 98.2 F (36.8 C) (Oral)   Ht 5' 10.47" (1.79 m)   Wt 155 lb 4.8 oz (70.4 kg)   SpO2 98%   BMI 21.99 kg/m  BP Readings from Last 3 Encounters:  01/12/23 100/70  01/05/22 118/80  06/26/21 102/80   Wt Readings from Last 3 Encounters:  01/12/23 155 lb 4.8 oz (70.4 kg)  01/05/22 154 lb (69.9 kg)  06/26/21 150 lb 6.4 oz (68.2 kg)      Physical Exam Vitals reviewed.  Constitutional:      General: He is not in acute distress.    Appearance: He is well-developed. He is not ill-appearing.  HENT:     Head: Normocephalic and atraumatic.     Right Ear: External ear  normal.     Left Ear: External ear normal.  Eyes:     Pupils: Pupils are equal, round, and reactive to light.  Neck:     Thyroid: No thyromegaly.  Cardiovascular:     Rate and Rhythm: Regular rhythm.     Heart sounds: Normal heart sounds. No murmur heard.    Comments: Slightly elevated heart rate of 105 but regular Pulmonary:     Effort: No respiratory distress.     Breath sounds: No wheezing or rales.  Abdominal:     General: Bowel sounds are normal. There is no distension.     Palpations: Abdomen is soft. There is no mass.     Tenderness: There is no abdominal tenderness. There is no guarding or rebound.  Musculoskeletal:     Cervical back: Normal range of motion and neck supple.     Right lower leg: No edema.     Left lower leg: No edema.  Lymphadenopathy:     Cervical: No cervical adenopathy.  Skin:    Findings: No rash.  Neurological:     Mental Status: He is alert and oriented to person, place, and time.     Cranial Nerves: No cranial nerve deficit.      No results found for any visits on 01/12/23.    The 10-year ASCVD risk score (Arnett DK, et al., 2019) is: 6.4%    Assessment & Plan:   Problem List Items Addressed This Visit   None Visit Diagnoses     Physical exam    -  Primary   Relevant Orders   Basic metabolic panel   Lipid panel   CBC with Differential/Platelet   Hepatic function panel   Tachycardia       Relevant Orders   TSH     Healthy 67 year old male.  He has current viral URI.  Slightly elevated heart rate which may be related to his concurrent illness.  Rhythm is very regular.  Also had couple of cups of coffee this morning which may have elevated slightly.  -Check labs as above -Recommend Prevnar 20 and patient consents -Flu vaccine already given -Discussed RSV vaccine and he is relatively low risk and declines -Colonoscopy up-to-date -Continue follow-up with urology regarding elevated PSA's -Check TSH with his slightly elevated  heart rate  No follow-ups on file.    Evelena Peat, MD

## 2023-01-12 NOTE — Addendum Note (Signed)
Addended by: Christy Sartorius on: 01/12/2023 08:41 AM   Modules accepted: Orders

## 2023-02-25 DIAGNOSIS — H43811 Vitreous degeneration, right eye: Secondary | ICD-10-CM | POA: Diagnosis not present

## 2023-02-25 DIAGNOSIS — H16223 Keratoconjunctivitis sicca, not specified as Sjogren's, bilateral: Secondary | ICD-10-CM | POA: Diagnosis not present

## 2023-02-25 DIAGNOSIS — H401131 Primary open-angle glaucoma, bilateral, mild stage: Secondary | ICD-10-CM | POA: Diagnosis not present

## 2023-02-25 DIAGNOSIS — Z961 Presence of intraocular lens: Secondary | ICD-10-CM | POA: Diagnosis not present

## 2023-03-08 DIAGNOSIS — Z125 Encounter for screening for malignant neoplasm of prostate: Secondary | ICD-10-CM | POA: Diagnosis not present

## 2023-03-16 DIAGNOSIS — R3914 Feeling of incomplete bladder emptying: Secondary | ICD-10-CM | POA: Diagnosis not present

## 2023-03-16 DIAGNOSIS — R972 Elevated prostate specific antigen [PSA]: Secondary | ICD-10-CM | POA: Diagnosis not present

## 2023-03-16 DIAGNOSIS — N5201 Erectile dysfunction due to arterial insufficiency: Secondary | ICD-10-CM | POA: Diagnosis not present

## 2023-06-07 DIAGNOSIS — Z961 Presence of intraocular lens: Secondary | ICD-10-CM | POA: Diagnosis not present

## 2023-06-07 DIAGNOSIS — H43811 Vitreous degeneration, right eye: Secondary | ICD-10-CM | POA: Diagnosis not present

## 2023-06-07 DIAGNOSIS — H401131 Primary open-angle glaucoma, bilateral, mild stage: Secondary | ICD-10-CM | POA: Diagnosis not present

## 2023-06-07 DIAGNOSIS — H4311 Vitreous hemorrhage, right eye: Secondary | ICD-10-CM | POA: Diagnosis not present

## 2023-06-14 DIAGNOSIS — H43811 Vitreous degeneration, right eye: Secondary | ICD-10-CM | POA: Diagnosis not present

## 2023-06-14 DIAGNOSIS — H43822 Vitreomacular adhesion, left eye: Secondary | ICD-10-CM | POA: Diagnosis not present

## 2023-06-14 DIAGNOSIS — H4311 Vitreous hemorrhage, right eye: Secondary | ICD-10-CM | POA: Diagnosis not present

## 2023-06-14 DIAGNOSIS — H33311 Horseshoe tear of retina without detachment, right eye: Secondary | ICD-10-CM | POA: Diagnosis not present

## 2023-06-14 DIAGNOSIS — H43391 Other vitreous opacities, right eye: Secondary | ICD-10-CM | POA: Diagnosis not present

## 2023-06-28 DIAGNOSIS — H43822 Vitreomacular adhesion, left eye: Secondary | ICD-10-CM | POA: Diagnosis not present

## 2023-06-28 DIAGNOSIS — H43391 Other vitreous opacities, right eye: Secondary | ICD-10-CM | POA: Diagnosis not present

## 2023-06-28 DIAGNOSIS — H4311 Vitreous hemorrhage, right eye: Secondary | ICD-10-CM | POA: Diagnosis not present

## 2023-06-28 DIAGNOSIS — H33311 Horseshoe tear of retina without detachment, right eye: Secondary | ICD-10-CM | POA: Diagnosis not present

## 2023-06-28 DIAGNOSIS — H43811 Vitreous degeneration, right eye: Secondary | ICD-10-CM | POA: Diagnosis not present

## 2023-07-12 DIAGNOSIS — H4311 Vitreous hemorrhage, right eye: Secondary | ICD-10-CM | POA: Diagnosis not present

## 2023-07-12 DIAGNOSIS — H43391 Other vitreous opacities, right eye: Secondary | ICD-10-CM | POA: Diagnosis not present

## 2023-07-12 DIAGNOSIS — H43811 Vitreous degeneration, right eye: Secondary | ICD-10-CM | POA: Diagnosis not present

## 2023-07-12 DIAGNOSIS — H31091 Other chorioretinal scars, right eye: Secondary | ICD-10-CM | POA: Diagnosis not present

## 2023-08-03 DIAGNOSIS — L814 Other melanin hyperpigmentation: Secondary | ICD-10-CM | POA: Diagnosis not present

## 2023-08-03 DIAGNOSIS — D225 Melanocytic nevi of trunk: Secondary | ICD-10-CM | POA: Diagnosis not present

## 2023-08-03 DIAGNOSIS — L821 Other seborrheic keratosis: Secondary | ICD-10-CM | POA: Diagnosis not present

## 2023-08-19 DIAGNOSIS — H4311 Vitreous hemorrhage, right eye: Secondary | ICD-10-CM | POA: Diagnosis not present

## 2023-08-19 DIAGNOSIS — H43391 Other vitreous opacities, right eye: Secondary | ICD-10-CM | POA: Diagnosis not present

## 2023-08-19 DIAGNOSIS — N200 Calculus of kidney: Secondary | ICD-10-CM | POA: Diagnosis not present

## 2023-08-19 DIAGNOSIS — R160 Hepatomegaly, not elsewhere classified: Secondary | ICD-10-CM | POA: Diagnosis not present

## 2023-08-19 DIAGNOSIS — H31091 Other chorioretinal scars, right eye: Secondary | ICD-10-CM | POA: Diagnosis not present

## 2023-08-19 DIAGNOSIS — H43811 Vitreous degeneration, right eye: Secondary | ICD-10-CM | POA: Diagnosis not present

## 2023-10-17 DIAGNOSIS — Z961 Presence of intraocular lens: Secondary | ICD-10-CM | POA: Diagnosis not present

## 2023-10-17 DIAGNOSIS — H401131 Primary open-angle glaucoma, bilateral, mild stage: Secondary | ICD-10-CM | POA: Diagnosis not present

## 2023-10-17 DIAGNOSIS — H4311 Vitreous hemorrhage, right eye: Secondary | ICD-10-CM | POA: Diagnosis not present

## 2023-10-17 DIAGNOSIS — H43811 Vitreous degeneration, right eye: Secondary | ICD-10-CM | POA: Diagnosis not present

## 2023-10-26 DIAGNOSIS — H31091 Other chorioretinal scars, right eye: Secondary | ICD-10-CM | POA: Diagnosis not present

## 2023-10-26 DIAGNOSIS — H43391 Other vitreous opacities, right eye: Secondary | ICD-10-CM | POA: Diagnosis not present

## 2023-10-26 DIAGNOSIS — H4311 Vitreous hemorrhage, right eye: Secondary | ICD-10-CM | POA: Diagnosis not present

## 2023-10-26 DIAGNOSIS — H43811 Vitreous degeneration, right eye: Secondary | ICD-10-CM | POA: Diagnosis not present

## 2023-12-16 ENCOUNTER — Encounter: Payer: Self-pay | Admitting: Family Medicine

## 2023-12-16 ENCOUNTER — Ambulatory Visit (INDEPENDENT_AMBULATORY_CARE_PROVIDER_SITE_OTHER): Admitting: Family Medicine

## 2023-12-16 VITALS — BP 100/70 | HR 73 | Temp 97.6°F | Ht 70.08 in | Wt 157.1 lb

## 2023-12-16 DIAGNOSIS — Z0000 Encounter for general adult medical examination without abnormal findings: Secondary | ICD-10-CM | POA: Diagnosis not present

## 2023-12-16 LAB — CBC WITH DIFFERENTIAL/PLATELET
Basophils Absolute: 0 K/uL (ref 0.0–0.1)
Basophils Relative: 0.9 % (ref 0.0–3.0)
Eosinophils Absolute: 0.1 K/uL (ref 0.0–0.7)
Eosinophils Relative: 1.6 % (ref 0.0–5.0)
HCT: 43.9 % (ref 39.0–52.0)
Hemoglobin: 15 g/dL (ref 13.0–17.0)
Lymphocytes Relative: 21.6 % (ref 12.0–46.0)
Lymphs Abs: 1.2 K/uL (ref 0.7–4.0)
MCHC: 34.1 g/dL (ref 30.0–36.0)
MCV: 91.3 fl (ref 78.0–100.0)
Monocytes Absolute: 0.5 K/uL (ref 0.1–1.0)
Monocytes Relative: 9 % (ref 3.0–12.0)
Neutro Abs: 3.7 K/uL (ref 1.4–7.7)
Neutrophils Relative %: 66.9 % (ref 43.0–77.0)
Platelets: 181 K/uL (ref 150.0–400.0)
RBC: 4.81 Mil/uL (ref 4.22–5.81)
RDW: 13.1 % (ref 11.5–15.5)
WBC: 5.5 K/uL (ref 4.0–10.5)

## 2023-12-16 LAB — HEPATIC FUNCTION PANEL
ALT: 15 U/L (ref 0–53)
AST: 19 U/L (ref 0–37)
Albumin: 4.6 g/dL (ref 3.5–5.2)
Alkaline Phosphatase: 61 U/L (ref 39–117)
Bilirubin, Direct: 0.1 mg/dL (ref 0.0–0.3)
Total Bilirubin: 1.4 mg/dL — ABNORMAL HIGH (ref 0.2–1.2)
Total Protein: 7.3 g/dL (ref 6.0–8.3)

## 2023-12-16 LAB — LIPID PANEL
Cholesterol: 216 mg/dL — ABNORMAL HIGH (ref 0–200)
HDL: 68.4 mg/dL (ref >39.00)
LDL Cholesterol: 133 mg/dL — ABNORMAL HIGH (ref 0–99)
NonHDL: 147.48
Total CHOL/HDL Ratio: 3
Triglycerides: 70 mg/dL (ref 0.0–149.0)
VLDL: 14 mg/dL (ref 0.0–40.0)

## 2023-12-16 LAB — BASIC METABOLIC PANEL WITH GFR
BUN: 29 mg/dL — ABNORMAL HIGH (ref 6–23)
CO2: 30 meq/L (ref 19–32)
Calcium: 9.8 mg/dL (ref 8.4–10.5)
Chloride: 101 meq/L (ref 96–112)
Creatinine, Ser: 1.14 mg/dL (ref 0.40–1.50)
GFR: 66.85 mL/min (ref >60.00)
Glucose, Bld: 85 mg/dL (ref 70–99)
Potassium: 5.2 meq/L — ABNORMAL HIGH (ref 3.5–5.1)
Sodium: 137 meq/L (ref 135–145)

## 2023-12-16 LAB — VITAMIN D 25 HYDROXY (VIT D DEFICIENCY, FRACTURES): VITD: 40.39 ng/mL (ref 30.00–100.00)

## 2023-12-16 MED ORDER — CLOBETASOL PROPIONATE 0.05 % EX SHAM: 1.0000 | MEDICATED_SHAMPOO | Freq: Two times a day (BID) | CUTANEOUS | 2 refills | Status: AC

## 2023-12-16 NOTE — Progress Notes (Signed)
 Established Patient Office Visit  Subjective   Patient ID: Ricardo Orr, male    DOB: 11-Jun-1955  Age: 68 y.o. MRN: [id number]  No chief complaint on file.   HPI   Ricardo Orr is seen for physical exam.  Fully retired since July.  Has been exercising more since retirement.  He is followed by urology for kidney stone history and also had retinal tear this past year and is seeing ophthalmologist closely for that.  Very health-conscious.  Weight has been stable for years.  Exercises with running, walking, cycling  Health maintenance reviewed:  Health Maintenance  Topic Date Due   Medicare Annual Wellness (AWV)  Never done   COVID-19 Vaccine (5 - 2025-26 season) 10/03/2023   DTaP/Tdap/Td (2 - Td or Tdap) 11/16/2027   Colonoscopy  06/28/2029   Pneumococcal Vaccine: 50+ Years  Completed   Influenza Vaccine  Completed   Hepatitis C Screening  Completed   Zoster Vaccines- Shingrix  Completed   Meningococcal B Vaccine  Aged Out   - Vaccines up-to-date.  No history of RSV but low risk.  Family history-mom had late onset dementia and died in her 55s.  Father had Paget's disease of the bone died in his 33s as well.  Brother died age 55 from sepsis which was a complication from a knee injection.  Sister alive and well.  No family history of premature CAD.  Social history-married with 3 grown children.  Retired in July.  Worked for years with [employer].  Smoked briefly in his 29s and 30s.  Rare alcohol use.  Exercises regularly.  Past Medical History:  Diagnosis Date   Allergy    Cataract    forming    Chronic kidney disease    chronic kidney stones-    Elevated PSA    Glaucoma    Past Surgical History:  Procedure Laterality Date   COLONOSCOPY     11 yrs ago- normal per pt    kidney stone extraction     x2   LITHOTRIPSY     PROSTATE BIOPSY     x3   VASECTOMY      reports that he has quit smoking. He has never used smokeless tobacco. He reports current alcohol use. He  reports that he does not use drugs. family history includes Dementia in his mother; Hearing loss in his father. No Known Allergies  Review of Systems  Constitutional:  Negative for chills, fever, malaise/fatigue and weight loss.  HENT:  Negative for hearing loss.   Eyes:  Negative for blurred vision and double vision.  Respiratory:  Negative for cough and shortness of breath.   Cardiovascular:  Negative for chest pain, palpitations and leg swelling.  Gastrointestinal:  Negative for abdominal pain, blood in stool, constipation and diarrhea.  Genitourinary:  Negative for dysuria.  Skin:  Negative for rash.  Neurological:  Negative for dizziness, speech change, seizures, loss of consciousness and headaches.  Psychiatric/Behavioral:  Negative for depression.       Objective:     BP 100/70   Pulse 73   Temp 97.6 F (36.4 C) (Oral)   Ht 5' 10.08 (1.78 m)   Wt 157 lb 1.6 oz (71.3 kg)   SpO2 96%   BMI 22.49 kg/m  BP Readings from Last 3 Encounters:  12/16/23 100/70  01/12/23 100/70  01/05/22 118/80   Wt Readings from Last 3 Encounters:  12/16/23 157 lb 1.6 oz (71.3 kg)  01/12/23 155 lb 4.8 oz (70.4 kg)  01/05/22 154 lb (69.9 kg)      Physical Exam Vitals reviewed.  Constitutional:      General: He is not in acute distress.    Appearance: He is well-developed. He is not ill-appearing.  HENT:     Head: Normocephalic and atraumatic.     Right Ear: External ear normal.     Left Ear: External ear normal.  Eyes:     Conjunctiva/sclera: Conjunctivae normal.     Pupils: Pupils are equal, round, and reactive to light.  Neck:     Thyroid : No thyromegaly.  Cardiovascular:     Rate and Rhythm: Normal rate and regular rhythm.     Heart sounds: Normal heart sounds. No murmur heard. Pulmonary:     Effort: No respiratory distress.     Breath sounds: No wheezing or rales.  Abdominal:     General: Bowel sounds are normal. There is no distension.     Palpations: Abdomen is soft.  There is no mass.     Tenderness: There is no abdominal tenderness. There is no guarding or rebound.  Musculoskeletal:     Cervical back: Normal range of motion and neck supple.     Right lower leg: No edema.     Left lower leg: No edema.  Lymphadenopathy:     Cervical: No cervical adenopathy.  Skin:    Findings: No rash.  Neurological:     Mental Status: He is alert and oriented to person, place, and time.     Cranial Nerves: No cranial nerve deficit.      No results found for any visits on 12/16/23.    The 10-year ASCVD risk score (Arnett DK, et al., 2019) is: 7.7%    Assessment & Plan:   Problem List Items Addressed This Visit   None Visit Diagnoses       Physical exam    -  Primary   Relevant Orders   Basic metabolic panel with GFR   Lipid panel   CBC with Differential/Platelet   Hepatic function panel   VITAMIN D 25 Hydroxy (Vit-D Deficiency, Fractures)     Very health-conscious 68 year old male here for physical.  He had past history of kidney stones in is followed regularly by urology and plans to get PSA through them.  We discussed several health maintenance items as follows  -Obtain labs as above.  Patient requesting vitamin D level - Flu vaccine already given.  Other immunizations up-to-date including Shingrix and pneumonia vaccine.  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  However, patient has never had elevated alkaline phosphatase which makes this less likely.  No follow-ups on file.    Wolm Scarlet, MD

## 2023-12-18 ENCOUNTER — Ambulatory Visit: Payer: Self-pay | Admitting: Family Medicine

## 2024-01-04 DIAGNOSIS — K080 Exfoliation of teeth due to systemic causes: Secondary | ICD-10-CM | POA: Diagnosis not present

## 2024-01-11 DIAGNOSIS — H43391 Other vitreous opacities, right eye: Secondary | ICD-10-CM | POA: Diagnosis not present

## 2024-01-11 DIAGNOSIS — H4311 Vitreous hemorrhage, right eye: Secondary | ICD-10-CM | POA: Diagnosis not present

## 2024-01-11 DIAGNOSIS — H43811 Vitreous degeneration, right eye: Secondary | ICD-10-CM | POA: Diagnosis not present

## 2024-01-11 DIAGNOSIS — H43822 Vitreomacular adhesion, left eye: Secondary | ICD-10-CM | POA: Diagnosis not present

## 2024-12-17 ENCOUNTER — Encounter: Admitting: Family Medicine
